# Patient Record
Sex: Female | Born: 1980 | Marital: Married | Smoking: Never smoker
Health system: Southern US, Community
[De-identification: ages and names within clinical notes are randomized; demographics above are authoritative.]

## PROBLEM LIST (undated history)

## (undated) DIAGNOSIS — Z973 Presence of spectacles and contact lenses: Secondary | ICD-10-CM

## (undated) DIAGNOSIS — K219 Gastro-esophageal reflux disease without esophagitis: Secondary | ICD-10-CM

## (undated) HISTORY — DX: Gastro-esophageal reflux disease without esophagitis: K21.9

---

## 2018-02-10 ENCOUNTER — Encounter: Payer: Self-pay | Admitting: Gastroenterology

## 2018-03-27 ENCOUNTER — Encounter: Payer: Self-pay | Admitting: Gastroenterology

## 2018-03-27 ENCOUNTER — Ambulatory Visit: Payer: 59 | Admitting: Gastroenterology

## 2018-03-27 ENCOUNTER — Other Ambulatory Visit: Payer: Self-pay

## 2018-03-27 VITALS — BP 120/84 | HR 94 | Ht 66.0 in | Wt 159.6 lb

## 2018-03-27 DIAGNOSIS — R196 Halitosis: Secondary | ICD-10-CM

## 2018-03-27 DIAGNOSIS — K219 Gastro-esophageal reflux disease without esophagitis: Secondary | ICD-10-CM | POA: Diagnosis not present

## 2018-03-27 NOTE — Progress Notes (Signed)
Arlyss Repressohini R Lorry Anastasi, MD [address]  [address]  [place], [place] [address]  Main: [phone]  Fax: [phone]    Gastroenterology Consultation  Referring Provider:     Wendy PoetBrooks, Jennifer Ann, P* Primary Care Physician:  Gus HeightBrooks, Jennifer Ann, PA-C Primary Gastroenterologist:  Dr. Arlyss Repressohini R Lemon Whitacre Reason for Consultation:     Reflux        HPI:   Verdon CumminsKatherine Beaudry is a 37 y.o. female referred by Dr. Shon BatonBrooks, Michell HeinrichJennifer Ann, PA-C  for consultation & management of chronic history of heartburn, throat discomfort, halitosis.  Patient initially saw a dentist due to halitosis and was told that she did not have any dental issues.  She then saw ENT, underwent direct laryngoscopy and she was told that she may have possible reflux and was started on omeprazole 20 mg twice daily.  She noticed that since commencement of omeprazole twice daily, her symptoms have significantly improved.  She no longer has these symptoms.  She gained weight in last 6 months to 1 year.  She also cut back on coffee which she figured was a trigger as well as orders.  Patient denies any nocturnal symptoms.  She has been on omeprazole 20mg  twice daily for last 2 months, in clinical remission  NSAIDs: None  Antiplts/Anticoagulants/Anti thrombotics: None  GI Procedures: None She denies family history of GI malignancy  History reviewed. No pertinent past medical history.  History reviewed. No pertinent surgical history.  Current Outpatient Medications:  .  fluticasone (FLONASE) 50 MCG/ACT nasal spray, Place 2 sprays into both nostrils daily., Disp: , Rfl: 3 .  omeprazole (PRILOSEC) 20 MG capsule, TAKE 1 CAPSULE BY MOUTH 30 MINUTES PRIOR TO BREAKFAST AND AT BEDTIME, Disp: , Rfl: 0   History reviewed. No pertinent family history.   Social History   Tobacco Use  . Smoking status: Never Smoker  . Smokeless tobacco: Never Used  Substance Use Topics  . Alcohol use: Yes  . Drug use: Not on file    Allergies as of  03/27/2018 - Review Complete 03/27/2018  Allergen Reaction Noted  . Tomato  03/27/2018    Review of Systems:    All systems reviewed and negative except where noted in HPI.   Physical Exam:  BP 120/84   Pulse 94   Ht 5\' 6"  (1.676 m)   Wt 159 lb 9.6 oz (72.4 kg)   BMI 25.76 kg/m  No LMP recorded.  General:   Alert,  Well-developed, well-nourished, pleasant and cooperative in NAD Head:  Normocephalic and atraumatic. Eyes:  Sclera clear, no icterus.   Conjunctiva pink. Ears:  Normal auditory acuity. Nose:  No deformity, discharge, or lesions. Mouth:  No deformity or lesions,oropharynx pink & moist. Neck:  Supple; no masses or thyromegaly. Lungs:  Respirations even and unlabored.  Clear throughout to auscultation.   No wheezes, crackles, or rhonchi. No acute distress. Heart:  Regular rate and rhythm; no murmurs, clicks, rubs, or gallops. Abdomen:  Normal bowel sounds. Soft, non-tender and non-distended without masses, hepatosplenomegaly or hernias noted.  No guarding or rebound tenderness.   Rectal: Not performed Msk:  Symmetrical without gross deformities. Good, equal movement & strength bilaterally. Pulses:  Normal pulses noted. Extremities:  No clubbing or edema.  No cyanosis. Neurologic:  Alert and oriented x3;  grossly normal neurologically. Skin:  Intact without significant lesions or rashes. No jaundice. Psych:  Alert and cooperative. Normal mood and affect.  Imaging Studies: None  Assessment and Plan:   Natalia LeatherwoodKatherine Mudry  is a 37 y.o. Caucasian female with no significant past medical history presents with chronic symptoms of throat discomfort, halitosis and heartburn, currently relieved by omeprazole 20 mg twice daily.  Reassured her that her symptoms are secondary to reflux and currently managed with acid suppressive therapy.  There is no indication for upper endoscopy as she does not demonstrate any alarm signs or symptoms.  Discussed with her about continuing  lifestyle modification, antireflux measures.  Suggested her to continue omeprazole 20 mg twice daily for at least 3 months before trial of dose reduction.  If her symptoms recur, will perform upper endoscopy   Follow up in 3 months   Arlyss Repress, MD

## 2018-04-10 ENCOUNTER — Telehealth: Payer: Self-pay | Admitting: Gastroenterology

## 2018-04-10 NOTE — Telephone Encounter (Signed)
Pt left vm she was seen 03/27/2018 and she was supposed to have rx called in to [facility] for rx Omeprazole 20 mg and it was never called in

## 2018-04-11 MED ORDER — OMEPRAZOLE 20 MG PO CPDR: 20.0000 mg | DELAYED_RELEASE_CAPSULE | Freq: Two times a day (BID) | ORAL | 0 refills | Status: DC

## 2018-04-11 NOTE — Telephone Encounter (Signed)
Medication has been sent to pharmacy, and pt has been notified

## 2018-05-03 ENCOUNTER — Other Ambulatory Visit: Payer: Self-pay | Admitting: Gastroenterology

## 2018-06-27 ENCOUNTER — Ambulatory Visit (INDEPENDENT_AMBULATORY_CARE_PROVIDER_SITE_OTHER): Payer: 59 | Admitting: Obstetrics and Gynecology

## 2018-06-27 ENCOUNTER — Other Ambulatory Visit (HOSPITAL_COMMUNITY)
Admission: RE | Admit: 2018-06-27 | Discharge: 2018-06-27 | Disposition: A | Discharge status: Home / Self Care | Payer: 59 | Source: Ambulatory Visit | Attending: Obstetrics and Gynecology | Admitting: Obstetrics and Gynecology

## 2018-06-27 ENCOUNTER — Encounter: Payer: Self-pay | Admitting: Obstetrics and Gynecology

## 2018-06-27 VITALS — BP 104/70 | HR 104 | Ht 66.0 in | Wt 165.0 lb

## 2018-06-27 DIAGNOSIS — Z1151 Encounter for screening for human papillomavirus (HPV): Secondary | ICD-10-CM

## 2018-06-27 DIAGNOSIS — Z01419 Encounter for gynecological examination (general) (routine) without abnormal findings: Secondary | ICD-10-CM | POA: Diagnosis not present

## 2018-06-27 DIAGNOSIS — Z124 Encounter for screening for malignant neoplasm of cervix: Secondary | ICD-10-CM | POA: Diagnosis not present

## 2018-06-27 DIAGNOSIS — Z3041 Encounter for surveillance of contraceptive pills: Secondary | ICD-10-CM

## 2018-06-27 MED ORDER — KARIVA 0.15-0.02/0.01 MG (21/5) PO TABS: 1.0000 | ORAL_TABLET | Freq: Every day | ORAL | 3 refills | Status: DC

## 2018-06-27 NOTE — Patient Instructions (Signed)
I value your feedback and entrusting us with your care. If you get a [facility] patient survey, I would appreciate you taking the time to let us know about your experience today. Thank you! 

## 2018-06-27 NOTE — Progress Notes (Signed)
PCP:  Gus Height, PA-C   Chief Complaint  Patient presents with  . Gynecologic Exam     HPI:      [name] is a 38 y.o. No obstetric history on file. who LMP was Patient's last menstrual period was 06/15/2018 (exact date)., presents today for her NP annual examination.  Her menses are regular every 28-30 days, lasting 4 days.  Dysmenorrhea none. She does not have intermenstrual bleeding.  Sex activity: single partner, contraception - OCP (estrogen/progesterone). OCPs Last Pap: 2 yrs ago, no hx of abn Hx of STDs: none  There is no FH of breast cancer. There is no FH of ovarian cancer. The patient does do self-breast exams.  Tobacco use: The patient denies current or previous tobacco use. Alcohol use: social drinker No drug use.  Exercise: moderately active  She does get adequate calcium and Vitamin D in her diet.   Past Medical History:  Diagnosis Date  . Acid reflux    omeprazole prescribed by GI, pt produces too much acid    History reviewed. No pertinent surgical history.  Family History  Problem Relation Age of Onset  . Breast cancer Neg Hx   . Ovarian cancer Neg Hx     Social History   Socioeconomic History  . Marital status: Married    Spouse name: Not on file  . Number of children: Not on file  . Years of education: Not on file  . Highest education level: Not on file  Occupational History  . Not on file  Social Needs  . Financial resource strain: Not on file  . Food insecurity:    Worry: Not on file    Inability: Not on file  . Transportation needs:    Medical: Not on file    Non-medical: Not on file  Tobacco Use  . Smoking status: Never Smoker  . Smokeless tobacco: Never Used  Substance and Sexual Activity  . Alcohol use: Yes  . Drug use: Never  . Sexual activity: Yes    Birth control/protection: Pill  Lifestyle  . Physical activity:    Days per week: Not on file    Minutes per session: Not on file  . Stress:  Not on file  Relationships  . Social connections:    Talks on phone: Not on file    Gets together: Not on file    Attends religious service: Not on file    Active member of club or organization: Not on file    Attends meetings of clubs or organizations: Not on file    Relationship status: Not on file  . Intimate partner violence:    Fear of current or ex partner: Not on file    Emotionally abused: Not on file    Physically abused: Not on file    Forced sexual activity: Not on file  Other Topics Concern  . Not on file  Social History Narrative  . Not on file    Outpatient Medications Prior to Visit  Medication Sig Dispense Refill  . fluticasone (FLONASE) 50 MCG/ACT nasal spray Place 2 sprays into both nostrils daily.  3  . omeprazole (PRILOSEC) 20 MG capsule TAKE 1 CAPSULE 2 TIMES DAILY BEFORE A MEAL (30 MINUTES PRIOR TO BREAKFAST AND BEDTIME) 60 capsule 0  . KARIVA 0.15-0.02/0.01 MG (21/5) tablet      No facility-administered medications prior to visit.       ROS:  Review of Systems  Constitutional: Negative for fatigue,  fever and unexpected weight change.  Respiratory: Negative for cough, shortness of breath and wheezing.   Cardiovascular: Negative for chest pain, palpitations and leg swelling.  Gastrointestinal: Negative for blood in stool, constipation, diarrhea, nausea and vomiting.  Endocrine: Negative for cold intolerance, heat intolerance and polyuria.  Genitourinary: Negative for dyspareunia, dysuria, flank pain, frequency, genital sores, hematuria, menstrual problem, pelvic pain, urgency, vaginal bleeding, vaginal discharge and vaginal pain.  Musculoskeletal: Negative for back pain, joint swelling and myalgias.  Skin: Negative for rash.  Neurological: Negative for dizziness, syncope, light-headedness, numbness and headaches.  Hematological: Negative for adenopathy.  Psychiatric/Behavioral: Negative for agitation, confusion, sleep disturbance and suicidal ideas.  The patient is not nervous/anxious.    BREAST: No symptoms   Objective: BP 104/70   Pulse (!) 104   Ht 5\' 6"  (1.676 m)   Wt 165 lb (74.8 kg)   LMP 06/15/2018 (Exact Date)   BMI 26.63 kg/m    Physical Exam Constitutional:      Appearance: She is well-developed.  Genitourinary:     Vulva, vagina, cervix, uterus, right adnexa and left adnexa normal.     No vulval lesion or tenderness noted.     No vaginal discharge, erythema or tenderness.     No cervical polyp.     Uterus is not enlarged or tender.     No right or left adnexal mass present.     Right adnexa not tender.     Left adnexa not tender.  Neck:     Musculoskeletal: Normal range of motion.     Thyroid: No thyromegaly.  Cardiovascular:     Rate and Rhythm: Normal rate and regular rhythm.     Heart sounds: Normal heart sounds. No murmur.  Pulmonary:     Effort: Pulmonary effort is normal.     Breath sounds: Normal breath sounds.  Chest:     Breasts:        Right: No mass, nipple discharge, skin change or tenderness.        Left: No mass, nipple discharge, skin change or tenderness.  Abdominal:     Palpations: Abdomen is soft.     Tenderness: There is no abdominal tenderness. There is no guarding.  Musculoskeletal: Normal range of motion.  Neurological:     General: No focal deficit present.     Mental Status: She is alert and oriented to person, place, and time.     Cranial Nerves: No cranial nerve deficit.  Skin:    General: Skin is warm and dry.  Psychiatric:        Mood and Affect: Mood normal.        Behavior: Behavior normal.        Thought Content: Thought content normal.        Judgment: Judgment normal.  Vitals signs reviewed.     Assessment/Plan: Encounter for annual routine gynecological examination  Cervical cancer screening - Plan: Cytology - PAP  Screening for HPV (human papillomavirus) - Plan: Cytology - PAP  Encounter for surveillance of contraceptive pills - OCP RF - Plan: KARIVA  0.15-0.02/0.01 MG (21/5) tablet  Meds ordered this encounter  Medications  . KARIVA 0.15-0.02/0.01 MG (21/5) tablet    Sig: Take 1 tablet by mouth daily for 28 days.    Dispense:  3 Package    Refill:  3    Order Specific Question:   Supervising Provider    Answer:   Nadara Mustard [phone]  GYN counsel adequate intake of calcium and vitamin D, diet and exercise     F/U  Return in about 1 year (around 06/27/2019).  Jorel Gravlin B. Jadasia Haws, PA-C 06/27/2018 3:17 PM

## 2018-06-29 LAB — CYTOLOGY - PAP
Diagnosis: NEGATIVE
HPV (WINDOPATH): NOT DETECTED

## 2018-07-03 ENCOUNTER — Ambulatory Visit: Payer: 59 | Admitting: Gastroenterology

## 2018-12-18 ENCOUNTER — Encounter (INDEPENDENT_AMBULATORY_CARE_PROVIDER_SITE_OTHER): Payer: Self-pay

## 2018-12-18 ENCOUNTER — Other Ambulatory Visit: Payer: Self-pay

## 2018-12-18 ENCOUNTER — Ambulatory Visit: Payer: 59 | Admitting: Gastroenterology

## 2018-12-18 ENCOUNTER — Other Ambulatory Visit: Payer: Self-pay | Admitting: Gastroenterology

## 2018-12-18 ENCOUNTER — Encounter: Payer: Self-pay | Admitting: Gastroenterology

## 2018-12-18 VITALS — BP 126/85 | HR 118 | Temp 99.5°F | Ht 66.0 in | Wt 169.4 lb

## 2018-12-18 DIAGNOSIS — R1013 Epigastric pain: Secondary | ICD-10-CM | POA: Diagnosis not present

## 2018-12-18 NOTE — Patient Instructions (Signed)
During today's visits labs were completed.  Dr.Vanga's nurse will contact you once your results have been received and reviewed by Dr. Marius Ditch to advise.  Please follow up with our office as needed or sooner depending on lab results.  Have a great day, and thank you for letting [facility] serve your healthcare needs.

## 2018-12-18 NOTE — Progress Notes (Addendum)
Cephas Darby, MD [address]  [place]  [place], [place] [address]  Main: [phone]  Fax: [phone]    Gastroenterology Consultation  Referring Provider:     Bethena Roys* Primary Care Physician:  Arturo Morton, PA-C Primary Gastroenterologist:  Dr. Cephas Darby Reason for Consultation: Dyspepsia     HPI:   Alison Hunt is a 38 y.o. female referred by Dr. Rolena Infante, Jamelle Rushing, PA-C  for consultation & management of chronic history of heartburn, throat discomfort, halitosis.  Patient initially saw a dentist due to halitosis and was told that she did not have any dental issues.  She then saw ENT, underwent direct laryngoscopy and she was told that she may have possible reflux and was started on omeprazole 20 mg twice daily.  She noticed that since commencement of omeprazole twice daily, her symptoms have significantly improved.  She no longer has these symptoms.  She gained weight in last 6 months to 1 year.  She also cut back on coffee which she figured was a trigger as well as orders.  Patient denies any nocturnal symptoms.  She has been on omeprazole 20mg  twice daily for last 2 months, in clinical remission  Follow-up visit 12/18/2018 For last 3 to 4 months, she has been experiencing epigastric burning pain which is relieved with food, associated with bloating, postprandial urgency and runny stools.  She has been gaining 1 pound a month.  She denies heartburn.  She is currently taking omeprazole 20 mg twice daily.   NSAIDs: None  Antiplts/Anticoagulants/Anti thrombotics: None  GI Procedures: None She denies family history of GI malignancy  Past Medical History:  Diagnosis Date  . Acid reflux    omeprazole prescribed by GI, pt produces too much acid    History reviewed. No pertinent surgical history.  Current Outpatient Medications:  .  fluticasone (FLONASE) 50 MCG/ACT nasal spray, Place 2 sprays into both nostrils daily., Disp: ,  Rfl: 3 .  omeprazole (PRILOSEC) 20 MG capsule, TAKE 1 CAPSULE 2 TIMES DAILY BEFORE A MEAL (30 MINUTES PRIOR TO BREAKFAST AND BEDTIME), Disp: 60 capsule, Rfl: 0 .  KARIVA 0.15-0.02/0.01 MG (21/5) tablet, Take 1 tablet by mouth daily for 28 days., Disp: 3 Package, Rfl: 3   Family History  Problem Relation Age of Onset  . Breast cancer Neg Hx   . Ovarian cancer Neg Hx      Social History   Tobacco Use  . Smoking status: Never Smoker  . Smokeless tobacco: Never Used  Substance Use Topics  . Alcohol use: Yes  . Drug use: Never    Allergies as of 12/18/2018 - Review Complete 12/18/2018  Allergen Reaction Noted  . Tomato  03/27/2018    Review of Systems:    All systems reviewed and negative except where noted in HPI.   Physical Exam:  BP 126/85   Pulse (!) 118   Temp 99.5 F (37.5 C) (Oral)   Ht 5\' 6"  (1.676 m)   Wt 169 lb 6.4 oz (76.8 kg)   BMI 27.34 kg/m  No LMP recorded.  General:   Alert,  Well-developed, well-nourished, pleasant and cooperative in NAD Head:  Normocephalic and atraumatic. Eyes:  Sclera clear, no icterus.   Conjunctiva pink. Ears:  Normal auditory acuity. Nose:  No deformity, discharge, or lesions. Mouth:  No deformity or lesions,oropharynx pink & moist. Neck:  Supple; no masses or thyromegaly. Lungs:  Respirations even and unlabored.  Clear throughout to auscultation.  No wheezes, crackles, or rhonchi. No acute distress. Heart:  Regular rate and rhythm; no murmurs, clicks, rubs, or gallops. Abdomen:  Normal bowel sounds. Soft, non-tender and non-distended without masses, hepatosplenomegaly or hernias noted.  No guarding or rebound tenderness.   Rectal: Not performed Msk:  Symmetrical without gross deformities. Good, equal movement & strength bilaterally. Pulses:  Normal pulses noted. Extremities:  No clubbing or edema.  No cyanosis. Neurologic:  Alert and oriented x3;  grossly normal neurologically. Skin:  Intact without significant lesions or  rashes. No jaundice. Psych:  Alert and cooperative. Normal mood and affect.  Imaging Studies: None  Assessment and Plan:   Alison Hunt is a 38 y.o. Caucasian female with no significant past medical history is here for follow-up of chronic dyspepsia.  Patient is currently on omeprazole 20 mg twice daily.  I discussed with her about upper endoscopy which she is reluctant to undergo due to high deductible with her insurance.  She agreed to undergo H. pylori IgG, H. pylori breath test as she is on PPI.  If either test comes back positive, will treat her with triple therapy for H. pylori infection  Follow up as needed   Arlyss Repressohini R Vanga, MD

## 2018-12-19 LAB — H. PYLORI ANTIBODY, IGG: H. pylori, IgG AbS: 0.2 Index Value (ref 0.00–0.79)

## 2018-12-21 ENCOUNTER — Telehealth: Payer: Self-pay | Admitting: Gastroenterology

## 2018-12-21 NOTE — Telephone Encounter (Signed)
Patient called asking for her lab & breathing results.

## 2018-12-25 ENCOUNTER — Encounter: Payer: Self-pay | Admitting: Gastroenterology

## 2018-12-25 NOTE — Telephone Encounter (Signed)
Returned patient call.  Left voicemail H. pylori IgG antibody negative H. pylori breath test results did not come back.  Called the lab to find out the status Results should be faxed soon  Cephas Darby, MD [address]  [place]  [place], [place] [address]  Main: [phone]  Fax: [phone] Pager: [phone]

## 2018-12-26 ENCOUNTER — Other Ambulatory Visit: Payer: Self-pay

## 2018-12-26 ENCOUNTER — Telehealth: Payer: Self-pay

## 2018-12-26 DIAGNOSIS — R1013 Epigastric pain: Secondary | ICD-10-CM

## 2018-12-26 NOTE — Telephone Encounter (Signed)
-----   Message from Lin Landsman, MD sent at 12/25/2018  3:38 PM EDT ----- Please let her know that her H Pylori breath test also came back negative  Thanks RV

## 2018-12-26 NOTE — Telephone Encounter (Signed)
Pt notified of lab results and scheduled for an EGD.

## 2018-12-28 LAB — SPECIMEN STATUS REPORT

## 2018-12-28 LAB — H. PYLORI BREATH TEST: H pylori Breath Test: NEGATIVE

## 2019-02-08 ENCOUNTER — Other Ambulatory Visit: Payer: Self-pay

## 2019-02-08 DIAGNOSIS — R1013 Epigastric pain: Secondary | ICD-10-CM

## 2019-02-14 ENCOUNTER — Encounter: Payer: Self-pay | Admitting: *Deleted

## 2019-02-14 ENCOUNTER — Other Ambulatory Visit: Payer: Self-pay

## 2019-02-16 ENCOUNTER — Other Ambulatory Visit
Admission: RE | Admit: 2019-02-16 | Discharge: 2019-02-16 | Disposition: A | Discharge status: Home / Self Care | Payer: 59 | Source: Ambulatory Visit | Attending: Gastroenterology | Admitting: Gastroenterology

## 2019-02-16 DIAGNOSIS — Z01812 Encounter for preprocedural laboratory examination: Secondary | ICD-10-CM | POA: Diagnosis present

## 2019-02-16 DIAGNOSIS — Z20828 Contact with and (suspected) exposure to other viral communicable diseases: Secondary | ICD-10-CM | POA: Diagnosis not present

## 2019-02-16 LAB — SARS CORONAVIRUS 2 (TAT 6-24 HRS): SARS Coronavirus 2: NEGATIVE

## 2019-02-20 NOTE — Discharge Instructions (Signed)

## 2019-02-21 ENCOUNTER — Ambulatory Visit
Admission: RE | Admit: 2019-02-21 | Discharge: 2019-02-21 | Disposition: A | Discharge status: Home / Self Care | Payer: 59 | Attending: Gastroenterology | Admitting: Gastroenterology

## 2019-02-21 ENCOUNTER — Encounter
Admission: RE | Disposition: A | Discharge status: Home / Self Care | Payer: Self-pay | Source: Home / Self Care | Attending: Gastroenterology

## 2019-02-21 ENCOUNTER — Other Ambulatory Visit: Payer: Self-pay

## 2019-02-21 ENCOUNTER — Ambulatory Visit: Payer: 59 | Admitting: Anesthesiology

## 2019-02-21 DIAGNOSIS — R1013 Epigastric pain: Secondary | ICD-10-CM | POA: Diagnosis not present

## 2019-02-21 DIAGNOSIS — K219 Gastro-esophageal reflux disease without esophagitis: Secondary | ICD-10-CM | POA: Insufficient documentation

## 2019-02-21 DIAGNOSIS — K2950 Unspecified chronic gastritis without bleeding: Secondary | ICD-10-CM | POA: Insufficient documentation

## 2019-02-21 DIAGNOSIS — Z793 Long term (current) use of hormonal contraceptives: Secondary | ICD-10-CM | POA: Insufficient documentation

## 2019-02-21 HISTORY — PX: ESOPHAGOGASTRODUODENOSCOPY (EGD) WITH PROPOFOL: SHX5813

## 2019-02-21 HISTORY — DX: Presence of spectacles and contact lenses: Z97.3

## 2019-02-21 LAB — POCT PREGNANCY, URINE: Preg Test, Ur: NEGATIVE

## 2019-02-21 SURGERY — ESOPHAGOGASTRODUODENOSCOPY (EGD) WITH PROPOFOL
Anesthesia: General

## 2019-02-21 MED ORDER — GLYCOPYRROLATE 0.2 MG/ML IJ SOLN
INTRAMUSCULAR | Status: DC | PRN
  Administered 2019-02-21: 0.1 mg via INTRAVENOUS

## 2019-02-21 MED ORDER — LIDOCAINE HCL (CARDIAC) PF 100 MG/5ML IV SOSY
PREFILLED_SYRINGE | INTRAVENOUS | Status: DC | PRN
  Administered 2019-02-21: 30 mg via INTRAVENOUS

## 2019-02-21 MED ORDER — LACTATED RINGERS IV SOLN
INTRAVENOUS | Status: DC
  Administered 2019-02-21: 09:00:00 via INTRAVENOUS

## 2019-02-21 MED ORDER — STERILE WATER FOR IRRIGATION IR SOLN
Status: DC | PRN
  Administered 2019-02-21: 10:00:00

## 2019-02-21 MED ORDER — SODIUM CHLORIDE 0.9 % IV SOLN: INTRAVENOUS | Status: DC

## 2019-02-21 MED ORDER — PROPOFOL 10 MG/ML IV BOLUS
INTRAVENOUS | Status: DC | PRN
  Administered 2019-02-21: 40 mg via INTRAVENOUS
  Administered 2019-02-21 (×2): 30 mg via INTRAVENOUS
  Administered 2019-02-21: 150 mg via INTRAVENOUS

## 2019-02-21 SURGICAL SUPPLY — 33 items
BALLN DILATOR 10-12 8 (BALLOONS)
BALLN DILATOR 12-15 8 (BALLOONS)
BALLN DILATOR 15-18 8 (BALLOONS)
BALLN DILATOR CRE 0-12 8 (BALLOONS)
BALLN DILATOR ESOPH 8 10 CRE (MISCELLANEOUS) IMPLANT
BALLOON DILATOR 12-15 8 (BALLOONS) IMPLANT
BALLOON DILATOR 15-18 8 (BALLOONS) IMPLANT
BALLOON DILATOR CRE 0-12 8 (BALLOONS) IMPLANT
BLOCK BITE 60FR ADLT L/F GRN (MISCELLANEOUS) ×3 IMPLANT
CANISTER SUCT 1200ML W/VALVE (MISCELLANEOUS) ×3 IMPLANT
CLIP HMST 235XBRD CATH ROT (MISCELLANEOUS) IMPLANT
CLIP RESOLUTION 360 11X235 (MISCELLANEOUS)
ELECT REM PT RETURN 9FT ADLT (ELECTROSURGICAL)
ELECTRODE REM PT RTRN 9FT ADLT (ELECTROSURGICAL) IMPLANT
FCP ESCP3.2XJMB 240X2.8X (MISCELLANEOUS) ×1
FORCEPS BIOP RAD 4 LRG CAP 4 (CUTTING FORCEPS) IMPLANT
FORCEPS BIOP RJ4 240 W/NDL (MISCELLANEOUS) ×2
FORCEPS ESCP3.2XJMB 240X2.8X (MISCELLANEOUS) ×1 IMPLANT
GOWN CVR UNV OPN BCK APRN NK (MISCELLANEOUS) ×2 IMPLANT
GOWN ISOL THUMB LOOP REG UNIV (MISCELLANEOUS) ×4
INJECTOR VARIJECT VIN23 (MISCELLANEOUS) IMPLANT
KIT DEFENDO VALVE AND CONN (KITS) IMPLANT
KIT ENDO PROCEDURE OLY (KITS) ×3 IMPLANT
MARKER SPOT ENDO TATTOO 5ML (MISCELLANEOUS) IMPLANT
RETRIEVER NET PLAT FOOD (MISCELLANEOUS) IMPLANT
SNARE SHORT THROW 13M SML OVAL (MISCELLANEOUS) IMPLANT
SNARE SHORT THROW 30M LRG OVAL (MISCELLANEOUS) IMPLANT
SPOT EX ENDOSCOPIC TATTOO (MISCELLANEOUS)
SYR INFLATION 60ML (SYRINGE) IMPLANT
TRAP ETRAP POLY (MISCELLANEOUS) IMPLANT
VARIJECT INJECTOR VIN23 (MISCELLANEOUS)
WATER STERILE IRR 250ML POUR (IV SOLUTION) ×3 IMPLANT
WIRE CRE 18-20MM 8CM F G (MISCELLANEOUS) IMPLANT

## 2019-02-21 NOTE — Transfer of Care (Signed)
Immediate Anesthesia Transfer of Care Note  Patient: Alison Hunt  Procedure(s) Performed: ESOPHAGOGASTRODUODENOSCOPY (EGD) WITH PROPOFOL (N/A )  Patient Location: PACU  Anesthesia Type: General  Level of Consciousness: awake, alert  and patient cooperative  Airway and Oxygen Therapy: Patient Spontanous Breathing and Patient connected to supplemental oxygen  Post-op Assessment: Post-op Vital signs reviewed, Patient's Cardiovascular Status Stable, Respiratory Function Stable, Patent Airway and No signs of Nausea or vomiting  Post-op Vital Signs: Reviewed and stable  Complications: No apparent anesthesia complications

## 2019-02-21 NOTE — Anesthesia Procedure Notes (Signed)
Date/Time: 02/21/2019 9:41 AM Performed by: Cameron Ali, CRNA Pre-anesthesia Checklist: Patient identified, Emergency Drugs available, Suction available, Timeout performed and Patient being monitored Patient Re-evaluated:Patient Re-evaluated prior to induction Oxygen Delivery Method: Nasal cannula Placement Confirmation: positive ETCO2

## 2019-02-21 NOTE — Anesthesia Preprocedure Evaluation (Signed)
Anesthesia Evaluation  Patient identified by MRN, date of birth, ID band Patient awake    Reviewed: Allergy & Precautions, NPO status , Patient's Chart, lab work & pertinent test results  History of Anesthesia Complications Negative for: history of anesthetic complications  Airway Mallampati: I  TM Distance: >3 FB Neck ROM: Full    Dental no notable dental hx.    Pulmonary neg pulmonary ROS,    Pulmonary exam normal breath sounds clear to auscultation       Cardiovascular Exercise Tolerance: Good Normal cardiovascular exam Rhythm:Regular Rate:Normal     Neuro/Psych negative neurological ROS     GI/Hepatic Neg liver ROS, GERD  Medicated,  Endo/Other  negative endocrine ROS  Renal/GU negative Renal ROS  negative genitourinary   Musculoskeletal negative musculoskeletal ROS (+)   Abdominal (+) - obese,  Abdomen: soft.    Peds  Hematology negative hematology ROS (+)   Anesthesia Other Findings   Reproductive/Obstetrics negative OB ROS                             Anesthesia Physical Anesthesia Plan  ASA: I  Anesthesia Plan: General   Post-op Pain Management:    Induction:   PONV Risk Score and Plan: 2 and Propofol infusion  Airway Management Planned: Natural Airway  Additional Equipment:   Intra-op Plan:   Post-operative Plan:   Informed Consent: I have reviewed the patients History and Physical, chart, labs and discussed the procedure including the risks, benefits and alternatives for the proposed anesthesia with the patient or authorized representative who has indicated his/her understanding and acceptance.     Dental advisory given  Plan Discussed with:   Anesthesia Plan Comments:         Anesthesia Quick Evaluation  There are no active problems to display for this patient.   No flowsheet data found. No flowsheet data found.  Risks and benefits of  anesthesia discussed at length, patient or surrogate demonstrates understanding. Appropriately NPO. Plan to proceed with anesthesia.  Champ Mungo, MD 02/21/19

## 2019-02-21 NOTE — Anesthesia Postprocedure Evaluation (Signed)
Anesthesia Post Note  Patient: Alison Hunt  Procedure(s) Performed: ESOPHAGOGASTRODUODENOSCOPY (EGD) WITH PROPOFOL (N/A )  Patient location during evaluation: PACU Anesthesia Type: General Level of consciousness: awake and alert Pain management: pain level controlled Vital Signs Assessment: post-procedure vital signs reviewed and stable Respiratory status: spontaneous breathing, nonlabored ventilation, respiratory function stable and patient connected to nasal cannula oxygen Cardiovascular status: blood pressure returned to baseline and stable Postop Assessment: no apparent nausea or vomiting Anesthetic complications: no    Sinda Du

## 2019-02-21 NOTE — Op Note (Signed)
[facility] Patient Name: Alison Hunt Procedure Date: 02/21/2019 9:32 AM MRN: [id number] Account #: [id number] Date of Birth: [birth year] Admit Type: Outpatient Age: 38 Room: [facility] OR ROOM 01 Gender: Female Note Status: Finalized Procedure:            Upper GI endoscopy Indications:          Dyspepsia Providers:            Lin Landsman MD, MD Medicines:            Monitored Anesthesia Care Complications:        No immediate complications. Estimated blood loss: None. Procedure:            Pre-Anesthesia Assessment:                       - Prior to the procedure, a History and Physical was                        performed, and patient medications and allergies were                        reviewed. The patient is competent. The risks and                        benefits of the procedure and the sedation options and                        risks were discussed with the patient. All questions                        were answered and informed consent was obtained.                        Patient identification and proposed procedure were                        verified by the physician, the nurse, the                        anesthesiologist, the anesthetist and the technician in                        the pre-procedure area in the procedure room in the                        endoscopy suite. Mental Status Examination: alert and                        oriented. Airway Examination: normal oropharyngeal                        airway and neck mobility. Respiratory Examination:                        clear to auscultation. CV Examination: normal.                        Prophylactic Antibiotics: The patient does not require  prophylactic antibiotics. Prior Anticoagulants: The                        patient has taken no previous anticoagulant or                        antiplatelet agents. ASA Grade Assessment: I - A              normal, healthy patient. After reviewing the risks and                        benefits, the patient was deemed in satisfactory                        condition to undergo the procedure. The anesthesia plan                        was to use monitored anesthesia care (MAC). Immediately                        prior to administration of medications, the patient was                        re-assessed for adequacy to receive sedatives. The                        heart rate, respiratory rate, oxygen saturations, blood                        pressure, adequacy of pulmonary ventilation, and                        response to care were monitored throughout the                        procedure. The physical status of the patient was                        re-assessed after the procedure.                       After obtaining informed consent, the endoscope was                        passed under direct vision. Throughout the procedure,                        the patient's blood pressure, pulse, and oxygen                        saturations were monitored continuously. The was                        introduced through the mouth, and advanced to the                        second part of duodenum. The upper GI endoscopy was                        accomplished without difficulty. The patient tolerated  the procedure well. Findings:      The duodenal bulb and second portion of the duodenum were normal.       Biopsies for histology were taken with a cold forceps for evaluation of       celiac disease.      The entire examined stomach was normal. Biopsies were taken with a cold       forceps for Helicobacter pylori testing.      The cardia and gastric fundus were normal on retroflexion.      Esophagogastric landmarks were identified: the gastroesophageal junction       was found at 40 cm from the incisors.      The gastroesophageal junction and examined esophagus were  normal. Impression:           - Normal duodenal bulb and second portion of the                        duodenum. Biopsied.                       - Normal stomach. Biopsied.                       - Esophagogastric landmarks identified.                       - Normal gastroesophageal junction and esophagus. Recommendation:       - Discharge patient to home (with escort).                       - Resume previous diet today.                       - Continue present medications.                       - Await pathology results.                       - Return to my office as previously scheduled. Procedure Code(s):    --- Professional ---                       [phone], Esophagogastroduodenoscopy, flexible, transoral;                        with biopsy, single or multiple Diagnosis Code(s):    --- Professional ---                       R10.13, Epigastric pain CPT copyright 2019 American Medical Association. All rights reserved. The codes documented in this report are preliminary and upon coder review may  be revised to meet current compliance requirements. Dr. Ulyess Mort Lin Landsman MD, MD 02/21/2019 10:00:48 AM This report has been signed electronically. Number of Addenda: 0 Note Initiated On: 02/21/2019 9:32 AM Estimated Blood Loss: Estimated blood loss: none.      [facility]

## 2019-02-21 NOTE — H&P (Signed)
Cephas Darby, MD [address]  [place]  [place], [place] [address]  Main: [phone]  Fax: [phone] Pager: [phone]  Primary Care Physician:  Arturo Morton, PA-C Primary Gastroenterologist:  Dr. Cephas Darby  Pre-Procedure History & Physical: HPI:  Alison Hunt is a 38 y.o. female is here for an endoscopy.   Past Medical History:  Diagnosis Date  . Acid reflux    omeprazole prescribed by GI, pt produces too much acid  . Wears contact lenses     Past Surgical History:  Procedure Laterality Date  . CESAREAN SECTION      Prior to Admission medications   Medication Sig Start Date End Date Taking? Authorizing Provider  fluticasone (FLONASE) 50 MCG/ACT nasal spray Place 2 sprays into both nostrils daily. 03/05/18  Yes [provider]  KARIVA 0.15-0.02/0.01 MG (21/5) tablet Take 1 tablet by mouth daily for 28 days. 06/27/18 18/29/93 Yes Copland, Deirdre Evener, PA-C  Multiple Vitamin (MULTIVITAMIN PO) Take by mouth daily.   Yes [provider]  omeprazole (PRILOSEC) 20 MG capsule TAKE 1 CAPSULE 2 TIMES DAILY BEFORE A MEAL (30 MINUTES PRIOR TO BREAKFAST AND BEDTIME) 05/03/18  Yes Lin Landsman, MD    Allergies as of 02/08/2019 - Review Complete 12/18/2018  Allergen Reaction Noted  . Tomato  03/27/2018    Family History  Problem Relation Age of Onset  . Breast cancer Neg Hx   . Ovarian cancer Neg Hx     Social History   Socioeconomic History  . Marital status: Married    Spouse name: Not on file  . Number of children: Not on file  . Years of education: Not on file  . Highest education level: Not on file  Occupational History  . Not on file  Social Needs  . Financial resource strain: Not on file  . Food insecurity    Worry: Not on file    Inability: Not on file  . Transportation needs    Medical: Not on file    Non-medical: Not on file  Tobacco Use  . Smoking status: Never Smoker  . Smokeless tobacco: Never Used   Substance and Sexual Activity  . Alcohol use: Not Currently  . Drug use: Never  . Sexual activity: Yes    Birth control/protection: Pill  Lifestyle  . Physical activity    Days per week: Not on file    Minutes per session: Not on file  . Stress: Not on file  Relationships  . Social Herbalist on phone: Not on file    Gets together: Not on file    Attends religious service: Not on file    Active member of club or organization: Not on file    Attends meetings of clubs or organizations: Not on file    Relationship status: Not on file  . Intimate partner violence    Fear of current or ex partner: Not on file    Emotionally abused: Not on file    Physically abused: Not on file    Forced sexual activity: Not on file  Other Topics Concern  . Not on file  Social History Narrative  . Not on file    Review of Systems: See HPI, otherwise negative ROS  Physical Exam: BP 112/71   Pulse 81   Temp 98.1 F (36.7 C) (Temporal)   Ht 5\' 6"  (1.676 m)   Wt 73 kg   LMP 01/23/2019 (Approximate) Comment: preg test neg  SpO2 100%   BMI 25.99 kg/m  General:   Alert,  pleasant and cooperative in NAD Head:  Normocephalic and atraumatic. Neck:  Supple; no masses or thyromegaly. Lungs:  Clear throughout to auscultation.    Heart:  Regular rate and rhythm. Abdomen:  Soft, nontender and nondistended. Normal bowel sounds, without guarding, and without rebound.   Neurologic:  Alert and  oriented x4;  grossly normal neurologically.  Impression/Plan: Alison Hunt is here for an endoscopy to be performed for dyspepsia  Risks, benefits, limitations, and alternatives regarding  endoscopy have been reviewed with the patient.  Questions have been answered.  All parties agreeable.   Lannette Donath, MD  02/21/2019, 8:48 AM

## 2019-02-22 ENCOUNTER — Encounter: Payer: Self-pay | Admitting: Gastroenterology

## 2019-02-23 ENCOUNTER — Other Ambulatory Visit: Payer: Self-pay | Admitting: Gastroenterology

## 2019-02-23 ENCOUNTER — Other Ambulatory Visit: Payer: Self-pay

## 2019-02-23 DIAGNOSIS — R1011 Right upper quadrant pain: Secondary | ICD-10-CM

## 2019-02-23 DIAGNOSIS — R1013 Epigastric pain: Secondary | ICD-10-CM

## 2019-02-26 ENCOUNTER — Other Ambulatory Visit: Payer: Self-pay

## 2019-02-26 DIAGNOSIS — R1013 Epigastric pain: Secondary | ICD-10-CM

## 2019-02-27 ENCOUNTER — Other Ambulatory Visit: Payer: Self-pay

## 2019-02-27 ENCOUNTER — Ambulatory Visit
Admission: RE | Admit: 2019-02-27 | Discharge: 2019-02-27 | Disposition: A | Discharge status: Home / Self Care | Payer: 59 | Source: Ambulatory Visit | Attending: Gastroenterology | Admitting: Gastroenterology

## 2019-02-27 DIAGNOSIS — R1011 Right upper quadrant pain: Secondary | ICD-10-CM | POA: Insufficient documentation

## 2019-03-13 ENCOUNTER — Other Ambulatory Visit (INDEPENDENT_AMBULATORY_CARE_PROVIDER_SITE_OTHER): Payer: Self-pay | Admitting: Nurse Practitioner

## 2019-03-13 DIAGNOSIS — I83811 Varicose veins of right lower extremities with pain: Secondary | ICD-10-CM

## 2019-03-14 ENCOUNTER — Ambulatory Visit (INDEPENDENT_AMBULATORY_CARE_PROVIDER_SITE_OTHER): Payer: 59

## 2019-03-14 ENCOUNTER — Encounter (INDEPENDENT_AMBULATORY_CARE_PROVIDER_SITE_OTHER): Payer: Self-pay | Admitting: Nurse Practitioner

## 2019-03-14 ENCOUNTER — Other Ambulatory Visit: Payer: Self-pay

## 2019-03-14 ENCOUNTER — Ambulatory Visit (INDEPENDENT_AMBULATORY_CARE_PROVIDER_SITE_OTHER): Payer: 59 | Admitting: Nurse Practitioner

## 2019-03-14 VITALS — BP 107/71 | HR 69 | Resp 16 | Wt 158.2 lb

## 2019-03-14 DIAGNOSIS — I83811 Varicose veins of right lower extremities with pain: Secondary | ICD-10-CM | POA: Diagnosis not present

## 2019-03-14 DIAGNOSIS — R1013 Epigastric pain: Secondary | ICD-10-CM | POA: Diagnosis not present

## 2019-03-14 NOTE — Patient Instructions (Signed)
Chronic Venous Insufficiency Chronic venous insufficiency is a condition where the leg veins cannot effectively pump blood from the legs to the heart. This happens when the vein walls are either stretched, weakened, or damaged, or when the valves inside the vein are damaged. With the right treatment, you should be able to continue with an active life. This condition is also called venous stasis. What are the causes? Common causes of this condition include:  High blood pressure inside the veins (venous hypertension).  Sitting or standing too long, causing increased blood pressure in the leg veins.  A blood clot that blocks blood flow in a vein (deep vein thrombosis, DVT).  Inflammation of a vein (phlebitis) that causes a blood clot to form.  Tumors in the pelvis that cause blood to back up. What increases the risk? The following factors may make you more likely to develop this condition:  Having a family history of this condition.  Obesity.  Pregnancy.  Living without enough regular physical activity or exercise (sedentary lifestyle).  Smoking.  Having a job that requires long periods of standing or sitting in one place.  Being a certain age. Women in their 40s and 50s and men in their 70s are more likely to develop this condition. What are the signs or symptoms? Symptoms of this condition include:  Veins that are enlarged, bulging, or twisted (varicose veins).  Skin breakdown or ulcers.  Reddened skin or dark discoloration of skin on the leg between the knee and ankle.  [place], smooth, tight, and painful skin just above the ankle, usually on the inside of the leg (lipodermatosclerosis).  Swelling of the legs. How is this diagnosed? This condition may be diagnosed based on:  Your medical history.  A physical exam.  Tests, such as: ? A procedure that creates an image of a blood vessel and nearby organs and provides information about blood flow through the blood vessel  (duplex ultrasound). ? A procedure that tests blood flow (plethysmography). ? A procedure that looks at the veins using X-ray and dye (venogram). How is this treated? The goals of treatment are to help you return to an active life and to minimize pain or disability. Treatment depends on the severity of your condition, and it may include:  Wearing compression stockings. These can help relieve symptoms and help prevent your condition from getting worse. However, they do not cure the condition.  Sclerotherapy. This procedure involves an injection of a solution that shrinks damaged veins.  Surgery. This may involve: ? Removing a diseased vein (vein stripping). ? Cutting off blood flow through the vein (laser ablation surgery). ? Repairing or reconstructing a valve within the affected vein. Follow these instructions at home:      Wear compression stockings as told by your health care provider. These stockings help to prevent blood clots and reduce swelling in your legs.  Take over-the-counter and prescription medicines only as told by your health care provider.  Stay active by exercising, walking, or doing different activities. Ask your health care provider what activities are safe for you and how much exercise you need.  Drink enough fluid to keep your urine pale yellow.  Do not use any products that contain nicotine or tobacco, such as cigarettes, e-cigarettes, and chewing tobacco. If you need help quitting, ask your health care provider.  Keep all follow-up visits as told by your health care provider. This is important. Contact a health care provider if you:  Have redness, swelling, or more pain   in the affected area.  See a red streak or line that goes up or down from the affected area.  Have skin breakdown or skin loss in the affected area, even if the breakdown is small.  Get an injury in the affected area. Get help right away if:  You get an injury and an open wound in the  affected area.  You have: ? Severe pain that does not get better with medicine. ? Sudden numbness or weakness in the foot or ankle below the affected area. ? Trouble moving your foot or ankle. ? A fever. ? Worse or persistent symptoms. ? Chest pain. ? Shortness of breath. Summary  Chronic venous insufficiency is a condition where the leg veins cannot effectively pump blood from the legs to the heart.  Chronic venous insufficiency occurs when the vein walls become stretched, weakened, or damaged, or when valves within the vein are damaged.  Treatment depends on how severe your condition is. It often involves wearing compression stockings and may involve having a procedure.  Make sure you stay active by exercising, walking, or doing different activities. Ask your health care provider what activities are safe for you and how much exercise you need. This information is not intended to replace advice given to you by your health care provider. Make sure you discuss any questions you have with your health care provider. Document Released: 08/16/2006 Document Revised: 01/03/2018 Document Reviewed: 01/03/2018 Elsevier Patient Education  2020 [employer].  Nonsurgical Procedures for Varicose Veins Various nonsurgical procedures can be used to treat varicose veins. Varicose veins are swollen, twisted veins that are visible under the skin. They occur most often in the legs. These veins may appear blue and bulging. Varicose veins are caused by damage to the valves in veins. All veins have a valve that makes blood flow in only one direction. If a valve gets weak or damaged, blood can pool and cause varicose veins. You may need a procedure to treat your varicose veins if they are causing symptoms or complications, or if lifestyle changes have not helped. These procedures can reduce pain, aching, and the risk of bleeding and blood clots. They can also improve the way the affected area looks (cosmetic  appearance). The three common nonsurgical procedures are:  Sclerotherapy. A chemical is injected to close off a vein.  Laser treatment. Light energy is applied to close off the vein.  Radiofrequency vein ablation. Electrical energy is used to produce heat that closes off the vein. Your health care provider will discuss the method that is best for you based on your condition. Tell a health care provider about:  Any allergies you have.  All medicines you are taking, including vitamins, herbs, eye drops, creams, and over-the-counter medicines.  Any problems you or family members have had with anesthetic medicines.  Any blood disorders you have.  Any surgeries you have had.  Any medical conditions you have.  Whether you are pregnant or may be pregnant. What are the risks? Generally, this is a safe procedure. However, problems may occur, including:  Damage to nearby nerves, tissues, or veins.  Skin irritation, sores, or dark spots.  Numbness.  Clotting.  Infection.  Allergic reactions to medicines.  Scarring.  Leg swelling.  Need for additional treatments.  Bruising. What happens before the procedure?  Ask your health care provider about: ? Changing or stopping your regular medicines. This is especially important if you are taking diabetes medicines or blood thinners. ? Taking over-the-counter medicines,  vitamins, herbs, and supplements. ? Taking medicines such as aspirin and ibuprofen. These medicines can thin your blood. Do not take these medicines unless your health care provider tells you to take them.  You may have an exam or testing. This can include a tests to: ? Check for clots and check blood flow using sound waves (Doppler ultrasound). ? Observe how blood flows through your veins by injecting a dye that outlines your veins on X-rays (angiogram). This test is used in rare cases. What happens during the procedure? One of the following procedures will be  performed: Sclerotherapy This procedure is often used for small to medium veins.  A chemical (sclerosant) that irritates the lining of the vein will be injected into the vein. This will cause the varicose vein to be closed off. Sclerosants in different amounts and strengths can be used, depending on the size and location of the vein.  All of the varicose vein sites will be injected. You may need more than one treatment because new varicose veins may develop, or more than one injection may be needed for each varicose vein.  Laser treatment There are two ways that lasers are used to treat varicose veins:  Light energy from a laser may be directed onto the vein through the skin.  A needle may be used to pass a thin laser catheter into the vein to cause it to close. You may need more than one treatment if the vein re-opens. In some cases, laser treatment may be combined with sclerotherapy. Radiofrequency vein ablation   You will be given a medicine that numbs the area (local anesthetic).  A small incision will be made near the varicose vein.  A thin tube (catheter) will be threaded into your vein.  The tip of the catheter will deploy electrodes.  The electrodes will deliver electrical energy to produce heat that closes off the vein. What happens after the procedure?  A bandage (dressing) may be used to cover the injection site or incisions.  You may have to wear compression stockings. These stockings help to prevent blood clots and reduce swelling in your legs.  Return to your normal activities as told by your health care provider. Summary  Varicose veins are swollen, twisted veins that are visible under the skin. They occur most often in the legs.  Various procedures can be used to treat varicose veins. You may need a procedure to treat your varicose veins if they are causing symptoms or complications, or if lifestyle changes have not helped.  Your health care provider will  discuss the method that is best for you based on your condition. This information is not intended to replace advice given to you by your health care provider. Make sure you discuss any questions you have with your health care provider. Document Released: 07/23/2016 Document Revised: 08/04/2018 Document Reviewed: 07/23/2016 Elsevier Patient Education  2020 [employer].

## 2019-03-15 ENCOUNTER — Ambulatory Visit: Admit: 2019-03-15 | Payer: 59 | Admitting: Gastroenterology

## 2019-03-15 SURGERY — ESOPHAGOGASTRODUODENOSCOPY (EGD) WITH PROPOFOL
Anesthesia: General

## 2019-03-18 ENCOUNTER — Encounter (INDEPENDENT_AMBULATORY_CARE_PROVIDER_SITE_OTHER): Payer: Self-pay | Admitting: Nurse Practitioner

## 2019-03-18 DIAGNOSIS — I83811 Varicose veins of right lower extremities with pain: Secondary | ICD-10-CM | POA: Insufficient documentation

## 2019-03-18 NOTE — Progress Notes (Signed)
SUBJECTIVE:  Patient ID: Alison Hunt, female    DOB: 06-25-1980, 38 y.o.   MRN: [id number] Chief Complaint  Patient presents with  . New Patient (Initial Visit)    ref [name] for varicose veins    HPI  Alison Hunt is a 38 y.o. female The patient is seen for evaluation of symptomatic varicose veins. The patient relates burning and stinging which worsened steadily throughout the course of the day, particularly with standing. The patient also notes an aching and throbbing pain over the varicosities, particularly with prolonged dependent positions. The symptoms are significantly improved with elevation.  The patient also notes that during hot weather the symptoms are greatly intensified. The patient states the pain from the varicose veins interferes with work, daily exercise, shopping and household maintenance. At this point, the symptoms are persistent and severe enough that they're having a negative impact on lifestyle and are interfering with daily activities.  There is no history of DVT, PE or superficial thrombophlebitis. There is no history of ulceration or hemorrhage. The patient denies a significant family history of varicose veins. OB history: G1P1002  The patient does note that this became acutely worse with pregnancy.  The patient has not worn graduated compression in the past. At the present time the patient has not been using over-the-counter analgesics. There is no history of prior surgical intervention or sclerotherapy.  The patient underwent noninvasive studies today.  The patient has reflux in her common femoral vein, popliteal vein.  She also has reflux in her great saphenous vein at the saphenofemoral junction down to the level of her great saphenous vein at the knee.    Past Medical History:  Diagnosis Date  . Acid reflux    omeprazole prescribed by GI, pt produces too much acid  . Wears contact lenses     Past Surgical History:  Procedure  Laterality Date  . CESAREAN SECTION    . ESOPHAGOGASTRODUODENOSCOPY (EGD) WITH PROPOFOL N/A 02/21/2019   Procedure: ESOPHAGOGASTRODUODENOSCOPY (EGD) WITH PROPOFOL;  Surgeon: Toney Reil, MD;  Location: [facility];  Service: Endoscopy;  Laterality: N/A;    Social History   Socioeconomic History  . Marital status: Married    Spouse name: Not on file  . Number of children: Not on file  . Years of education: Not on file  . Highest education level: Not on file  Occupational History  . Not on file  Social Needs  . Financial resource strain: Not on file  . Food insecurity    Worry: Not on file    Inability: Not on file  . Transportation needs    Medical: Not on file    Non-medical: Not on file  Tobacco Use  . Smoking status: Never Smoker  . Smokeless tobacco: Never Used  Substance and Sexual Activity  . Alcohol use: Not Currently  . Drug use: Never  . Sexual activity: Yes    Birth control/protection: Pill  Lifestyle  . Physical activity    Days per week: Not on file    Minutes per session: Not on file  . Stress: Not on file  Relationships  . Social Musician on phone: Not on file    Gets together: Not on file    Attends religious service: Not on file    Active member of club or organization: Not on file    Attends meetings of clubs or organizations: Not on file    Relationship status: Not on file  .  Intimate partner violence    Fear of current or ex partner: Not on file    Emotionally abused: Not on file    Physically abused: Not on file    Forced sexual activity: Not on file  Other Topics Concern  . Not on file  Social History Narrative  . Not on file    Family History  Problem Relation Age of Onset  . Breast cancer Neg Hx   . Ovarian cancer Neg Hx     Allergies  Allergen Reactions  . Tomato Nausea Only    Mouth ulcers     Review of Systems   Review of Systems: Negative Unless Checked Constitutional: [] Weight loss  [] Fever   [] Chills Cardiac: [] Chest pain   []  Atrial Fibrillation  [] Palpitations   [] Shortness of breath when laying flat   [] Shortness of breath with exertion. [] Shortness of breath at rest Vascular:  [] Pain in legs with walking   [] Pain in legs with standing [] Pain in legs when laying flat   [] Claudication    [] Pain in feet when laying flat    [] History of DVT   [] Phlebitis   [] Swelling in legs   [x] Varicose veins   [] Non-healing ulcers Pulmonary:   [] Uses home oxygen   [] Productive cough   [] Hemoptysis   [] Wheeze  [] COPD   [] Asthma Neurologic:  [] Dizziness   [] Seizures  [] Blackouts [] History of stroke   [] History of TIA  [] Aphasia   [] Temporary Blindness   [] Weakness or numbness in arm   [] Weakness or numbness in leg Musculoskeletal:   [] Joint swelling   [] Joint pain   [] Low back pain  []  History of Knee Replacement [] Arthritis [] back Surgeries  []  Spinal Stenosis    Hematologic:  [] Easy bruising  [] Easy bleeding   [] Hypercoagulable state   [] Anemic Gastrointestinal:  [] Diarrhea   [] Vomiting  [x] Gastroesophageal reflux/heartburn   [] Difficulty swallowing. [] Abdominal pain Genitourinary:  [] Chronic kidney disease   [] Difficult urination  [] Anuric   [] Blood in urine [] Frequent urination  [] Burning with urination   [] Hematuria Skin:  [] Rashes   [] Ulcers [] Wounds Psychological:  [] History of anxiety   []  History of major depression  []  Memory Difficulties      OBJECTIVE:   Physical Exam  BP 107/71 (BP Location: Right Arm)   Pulse 69   Resp 16   Wt 158 lb 3.2 oz (71.8 kg)   BMI 25.53 kg/m   Gen: WD/WN, NAD Head: [place]/AT, No temporalis wasting.  Ear/Nose/Throat: Hearing grossly intact, nares w/o erythema or drainage Eyes: PER, EOMI, sclera nonicteric.  Neck: Supple, no masses.  No JVD.  Pulmonary:  Good air movement, no use of accessory muscles.  Cardiac: RRR Vascular:  Varicose veins in right lower extremity Vessel Right Left  Radial Palpable Palpable  Dorsalis Pedis Palpable Palpable   Posterior Tibial Palpable Palpable   Gastrointestinal: soft, non-distended. No guarding/no peritoneal signs.  Musculoskeletal: M/S 5/5 throughout.  No deformity or atrophy.  Neurologic: Pain and light touch intact in extremities.  Symmetrical.  Speech is fluent. Motor exam as listed above. Psychiatric: Judgment intact, Mood & affect appropriate for pt's clinical situation. Dermatologic: No Venous rashes. No Ulcers Noted.  No changes consistent with cellulitis. Lymph : No Cervical lymphadenopathy, no lichenification or skin changes of chronic lymphedema.       ASSESSMENT AND PLAN:  1. Varicose veins of leg with pain, right  Recommend:  The patient has large symptomatic varicose veins that are painful and associated with swelling.  I have had a long  discussion with the patient regarding  varicose veins and why they cause symptoms.  Patient will begin wearing graduated compression stockings class 1 on a daily basis, beginning first thing in the morning and removing them in the evening. The patient is instructed specifically not to sleep in the stockings.    The patient  will also begin using over-the-counter analgesics such as Motrin 600 mg po TID to help control the symptoms.    In addition, behavioral modification including elevation during the day will be initiated.    Pending the results of these changes the  patient will be reevaluated in three months.   An  ultrasound of the venous system will be obtained.   Further plans will be based on the ultrasound results and whether conservative therapies are successful at eliminating the pain and swelling.   2. Dyspepsia Continue PPI as already ordered, this medication has been reviewed and there are no changes at this time.  Avoidence of caffeine and alcohol  Moderate elevation of the head of the bed    Current Outpatient Medications on File Prior to Visit  Medication Sig Dispense Refill  . fluticasone (FLONASE) 50 MCG/ACT nasal  spray Place 2 sprays into both nostrils daily.  3  . KARIVA 0.15-0.02/0.01 MG (21/5) tablet Take 1 tablet by mouth daily for 28 days. 3 Package 3  . Multiple Vitamin (MULTIVITAMIN PO) Take by mouth daily.    Marland Kitchen omeprazole (PRILOSEC) 20 MG capsule TAKE 1 CAPSULE 2 TIMES DAILY BEFORE A MEAL (30 MINUTES PRIOR TO BREAKFAST AND BEDTIME) 60 capsule 0   No current facility-administered medications on file prior to visit.     Patient Instructions  Chronic Venous Insufficiency Chronic venous insufficiency is a condition where the leg veins cannot effectively pump blood from the legs to the heart. This happens when the vein walls are either stretched, weakened, or damaged, or when the valves inside the vein are damaged. With the right treatment, you should be able to continue with an active life. This condition is also called venous stasis. What are the causes? Common causes of this condition include:  High blood pressure inside the veins (venous hypertension).  Sitting or standing too long, causing increased blood pressure in the leg veins.  A blood clot that blocks blood flow in a vein (deep vein thrombosis, DVT).  Inflammation of a vein (phlebitis) that causes a blood clot to form.  Tumors in the pelvis that cause blood to back up. What increases the risk? The following factors may make you more likely to develop this condition:  Having a family history of this condition.  Obesity.  Pregnancy.  Living without enough regular physical activity or exercise (sedentary lifestyle).  Smoking.  Having a job that requires long periods of standing or sitting in one place.  Being a certain age. Women in their 16s and 74s and men in their 48s are more likely to develop this condition. What are the signs or symptoms? Symptoms of this condition include:  Veins that are enlarged, bulging, or twisted (varicose veins).  Skin breakdown or ulcers.  Reddened skin or dark discoloration of skin on  the leg between the knee and ankle.  Marico Buckle, smooth, tight, and painful skin just above the ankle, usually on the inside of the leg (lipodermatosclerosis).  Swelling of the legs. How is this diagnosed? This condition may be diagnosed based on:  Your medical history.  A physical exam.  Tests, such as: ? A procedure that creates an  image of a blood vessel and nearby organs and provides information about blood flow through the blood vessel (duplex ultrasound). ? A procedure that tests blood flow (plethysmography). ? A procedure that looks at the veins using X-ray and dye (venogram). How is this treated? The goals of treatment are to help you return to an active life and to minimize pain or disability. Treatment depends on the severity of your condition, and it may include:  Wearing compression stockings. These can help relieve symptoms and help prevent your condition from getting worse. However, they do not cure the condition.  Sclerotherapy. This procedure involves an injection of a solution that shrinks damaged veins.  Surgery. This may involve: ? Removing a diseased vein (vein stripping). ? Cutting off blood flow through the vein (laser ablation surgery). ? Repairing or reconstructing a valve within the affected vein. Follow these instructions at home:      Wear compression stockings as told by your health care provider. These stockings help to prevent blood clots and reduce swelling in your legs.  Take over-the-counter and prescription medicines only as told by your health care provider.  Stay active by exercising, walking, or doing different activities. Ask your health care provider what activities are safe for you and how much exercise you need.  Drink enough fluid to keep your urine pale yellow.  Do not use any products that contain nicotine or tobacco, such as cigarettes, e-cigarettes, and chewing tobacco. If you need help quitting, ask your health care provider.  Keep  all follow-up visits as told by your health care provider. This is important. Contact a health care provider if you:  Have redness, swelling, or more pain in the affected area.  See a red streak or line that goes up or down from the affected area.  Have skin breakdown or skin loss in the affected area, even if the breakdown is small.  Get an injury in the affected area. Get help right away if:  You get an injury and an open wound in the affected area.  You have: ? Severe pain that does not get better with medicine. ? Sudden numbness or weakness in the foot or ankle below the affected area. ? Trouble moving your foot or ankle. ? A fever. ? Worse or persistent symptoms. ? Chest pain. ? Shortness of breath. Summary  Chronic venous insufficiency is a condition where the leg veins cannot effectively pump blood from the legs to the heart.  Chronic venous insufficiency occurs when the vein walls become stretched, weakened, or damaged, or when valves within the vein are damaged.  Treatment depends on how severe your condition is. It often involves wearing compression stockings and may involve having a procedure.  Make sure you stay active by exercising, walking, or doing different activities. Ask your health care provider what activities are safe for you and how much exercise you need. This information is not intended to replace advice given to you by your health care provider. Make sure you discuss any questions you have with your health care provider. Document Released: 08/16/2006 Document Revised: 01/03/2018 Document Reviewed: 01/03/2018 Elsevier Patient Education  2020 [employer]  Nonsurgical Procedures for Varicose Veins Various nonsurgical procedures can be used to treat varicose veins. Varicose veins are swollen, twisted veins that are visible under the skin. They occur most often in the legs. These veins may appear blue and bulging. Varicose veins are caused by damage to the  valves in veins. All veins have a valve  that makes blood flow in only one direction. If a valve gets weak or damaged, blood can pool and cause varicose veins. You may need a procedure to treat your varicose veins if they are causing symptoms or complications, or if lifestyle changes have not helped. These procedures can reduce pain, aching, and the risk of bleeding and blood clots. They can also improve the way the affected area looks (cosmetic appearance). The three common nonsurgical procedures are:  Sclerotherapy. A chemical is injected to close off a vein.  Laser treatment. Light energy is applied to close off the vein.  Radiofrequency vein ablation. Electrical energy is used to produce heat that closes off the vein. Your health care provider will discuss the method that is best for you based on your condition. Tell a health care provider about:  Any allergies you have.  All medicines you are taking, including vitamins, herbs, eye drops, creams, and over-the-counter medicines.  Any problems you or family members have had with anesthetic medicines.  Any blood disorders you have.  Any surgeries you have had.  Any medical conditions you have.  Whether you are pregnant or may be pregnant. What are the risks? Generally, this is a safe procedure. However, problems may occur, including:  Damage to nearby nerves, tissues, or veins.  Skin irritation, sores, or dark spots.  Numbness.  Clotting.  Infection.  Allergic reactions to medicines.  Scarring.  Leg swelling.  Need for additional treatments.  Bruising. What happens before the procedure?  Ask your health care provider about: ? Changing or stopping your regular medicines. This is especially important if you are taking diabetes medicines or blood thinners. ? Taking over-the-counter medicines, vitamins, herbs, and supplements. ? Taking medicines such as aspirin and ibuprofen. These medicines can thin your blood. Do not  take these medicines unless your health care provider tells you to take them.  You may have an exam or testing. This can include a tests to: ? Check for clots and check blood flow using sound waves (Doppler ultrasound). ? Observe how blood flows through your veins by injecting a dye that outlines your veins on X-rays (angiogram). This test is used in rare cases. What happens during the procedure? One of the following procedures will be performed: Sclerotherapy This procedure is often used for small to medium veins.  A chemical (sclerosant) that irritates the lining of the vein will be injected into the vein. This will cause the varicose vein to be closed off. Sclerosants in different amounts and strengths can be used, depending on the size and location of the vein.  All of the varicose vein sites will be injected. You may need more than one treatment because new varicose veins may develop, or more than one injection may be needed for each varicose vein.  Laser treatment There are two ways that lasers are used to treat varicose veins:  Light energy from a laser may be directed onto the vein through the skin.  A needle may be used to pass a thin laser catheter into the vein to cause it to close. You may need more than one treatment if the vein re-opens. In some cases, laser treatment may be combined with sclerotherapy. Radiofrequency vein ablation   You will be given a medicine that numbs the area (local anesthetic).  A small incision will be made near the varicose vein.  A thin tube (catheter) will be threaded into your vein.  The tip of the catheter will deploy electrodes.  The  electrodes will deliver electrical energy to produce heat that closes off the vein. What happens after the procedure?  A bandage (dressing) may be used to cover the injection site or incisions.  You may have to wear compression stockings. These stockings help to prevent blood clots and reduce swelling in  your legs.  Return to your normal activities as told by your health care provider. Summary  Varicose veins are swollen, twisted veins that are visible under the skin. They occur most often in the legs.  Various procedures can be used to treat varicose veins. You may need a procedure to treat your varicose veins if they are causing symptoms or complications, or if lifestyle changes have not helped.  Your health care provider will discuss the method that is best for you based on your condition. This information is not intended to replace advice given to you by your health care provider. Make sure you discuss any questions you have with your health care provider. Document Released: 07/23/2016 Document Revised: 08/04/2018 Document Reviewed: 07/23/2016 Elsevier Patient Education  2020 [employer].   Return in about 3 months (around 06/14/2019) for Varicose veins.   Georgiana Spinner, NP  This note was completed with Office manager.  Any errors are purely unintentional.

## 2019-04-24 ENCOUNTER — Telehealth: Payer: Self-pay

## 2019-04-24 LAB — ALPHA-GAL PANEL
Alpha Gal IgE*: 0.25 kU/L — ABNORMAL HIGH (ref <0.10)
Beef (Bos spp) IgE: 0.1 kU/L (ref <0.35)
Class Interpretation: 0
Class Interpretation: 0
Lamb/Mutton (Ovis spp) IgE: <0.1 kU/L (ref <0.35)
Pork (Sus spp) IgE: <0.1 kU/L (ref <0.35)

## 2019-04-24 NOTE — Telephone Encounter (Signed)
Pt has been notified of results and verbalized understanding  

## 2019-05-23 ENCOUNTER — Other Ambulatory Visit: Payer: Self-pay

## 2019-05-23 DIAGNOSIS — Z3041 Encounter for surveillance of contraceptive pills: Secondary | ICD-10-CM

## 2019-05-23 MED ORDER — KARIVA 0.15-0.02/0.01 MG (21/5) PO TABS: 1.0000 | ORAL_TABLET | Freq: Every day | ORAL | 0 refills | Status: DC

## 2019-05-23 NOTE — Telephone Encounter (Signed)
Pt calling for refill of bc Alison Hunt to [facility].  Appt sched 06/28/19.  [phone] Pt aware eRx'd.

## 2019-06-19 ENCOUNTER — Ambulatory Visit (INDEPENDENT_AMBULATORY_CARE_PROVIDER_SITE_OTHER): Payer: 59 | Admitting: Vascular Surgery

## 2019-06-28 ENCOUNTER — Ambulatory Visit: Payer: 59 | Admitting: Obstetrics and Gynecology

## 2019-08-03 ENCOUNTER — Other Ambulatory Visit: Payer: Self-pay

## 2019-08-03 DIAGNOSIS — Z3041 Encounter for surveillance of contraceptive pills: Secondary | ICD-10-CM

## 2019-08-03 MED ORDER — KARIVA 0.15-0.02/0.01 MG (21/5) PO TABS: 1.0000 | ORAL_TABLET | Freq: Every day | ORAL | 0 refills | Status: DC

## 2019-08-03 NOTE — Telephone Encounter (Signed)
PT left msg on triage asking for one more BC RF. She had to reschedule her annual due to exposure to covid. [facility] RF sent, pt aware.

## 2019-08-06 ENCOUNTER — Ambulatory Visit: Payer: 59 | Admitting: Obstetrics and Gynecology

## 2019-08-28 ENCOUNTER — Other Ambulatory Visit: Payer: Self-pay

## 2019-08-28 ENCOUNTER — Encounter: Payer: Self-pay | Admitting: Obstetrics and Gynecology

## 2019-08-28 ENCOUNTER — Ambulatory Visit (INDEPENDENT_AMBULATORY_CARE_PROVIDER_SITE_OTHER): Payer: 59 | Admitting: Obstetrics and Gynecology

## 2019-08-28 VITALS — BP 110/80 | Ht 66.0 in | Wt 166.0 lb

## 2019-08-28 DIAGNOSIS — Z01419 Encounter for gynecological examination (general) (routine) without abnormal findings: Secondary | ICD-10-CM | POA: Diagnosis not present

## 2019-08-28 DIAGNOSIS — Z3041 Encounter for surveillance of contraceptive pills: Secondary | ICD-10-CM | POA: Diagnosis not present

## 2019-08-28 MED ORDER — DESOGESTREL-ETHINYL ESTRADIOL 0.15-0.02/0.01 MG (21/5) PO TABS: 1.0000 | ORAL_TABLET | Freq: Every day | ORAL | 3 refills | Status: DC

## 2019-08-28 NOTE — Patient Instructions (Signed)
I value your feedback and entrusting us with your care. If you get a [facility] patient survey, I would appreciate you taking the time to let us know about your experience today. Thank you!  As of April 05, 2019, your lab results will be released to your MyChart immediately, before I even have a chance to see them. Please give me time to review them and contact you if there are any abnormalities. Thank you for your patience.  

## 2019-08-28 NOTE — Progress Notes (Signed)
PCP:  Mick Sell, MD   Chief Complaint  Patient presents with  . Gynecologic Exam     HPI:      [name] is a 39 y.o. No obstetric history on file. who LMP was Patient's last menstrual period was 08/11/2019 (approximate)., presents today for her annual examination.  Her menses are regular every 28-30 days, lasting 4 days.  Dysmenorrhea mild. She does not have intermenstrual bleeding.  Sex activity: single partner, contraception - OCP (estrogen/progesterone).  Last Pap: 06/27/18 Results: no abnormalities/neg HPV NDA; no hx of abn Hx of STDs: none  There is no FH of breast cancer. There is no FH of ovarian cancer. The patient does do self-breast exams.  Tobacco use: The patient denies current or previous tobacco use. Alcohol use: social drinker No drug use.  Exercise: moderately active  She does get adequate calcium but not Vitamin D in her diet.   Past Medical History:  Diagnosis Date  . Acid reflux    omeprazole prescribed by GI, pt produces too much acid  . Wears contact lenses     Past Surgical History:  Procedure Laterality Date  . CESAREAN SECTION    . ESOPHAGOGASTRODUODENOSCOPY (EGD) WITH PROPOFOL N/A 02/21/2019   Procedure: ESOPHAGOGASTRODUODENOSCOPY (EGD) WITH PROPOFOL;  Surgeon: Toney Reil, MD;  Location: [facility];  Service: Endoscopy;  Laterality: N/A;    Family History  Problem Relation Age of Onset  . Breast cancer Neg Hx   . Ovarian cancer Neg Hx     Social History   Socioeconomic History  . Marital status: Married    Spouse name: Not on file  . Number of children: Not on file  . Years of education: Not on file  . Highest education level: Not on file  Occupational History  . Not on file  Tobacco Use  . Smoking status: Never Smoker  . Smokeless tobacco: Never Used  Substance and Sexual Activity  . Alcohol use: Not Currently  . Drug use: Never  . Sexual activity: Yes    Birth control/protection:  Pill  Other Topics Concern  . Not on file  Social History Narrative  . Not on file   Social Determinants of Health   Financial Resource Strain:   . Difficulty of Paying Living Expenses:   Food Insecurity:   . Worried About Programme researcher, broadcasting/film/video in the Last Year:   . Barista in the Last Year:   Transportation Needs:   . Freight forwarder (Medical):   Marland Kitchen Lack of Transportation (Non-Medical):   Physical Activity:   . Days of Exercise per Week:   . Minutes of Exercise per Session:   Stress:   . Feeling of Stress :   Social Connections:   . Frequency of Communication with Friends and Family:   . Frequency of Social Gatherings with Friends and Family:   . Attends Religious Services:   . Active Member of Clubs or Organizations:   . Attends Banker Meetings:   Marland Kitchen Marital Status:   Intimate Partner Violence:   . Fear of Current or Ex-Partner:   . Emotionally Abused:   Marland Kitchen Physically Abused:   . Sexually Abused:     Outpatient Medications Prior to Visit  Medication Sig Dispense Refill  . fluticasone (FLONASE) 50 MCG/ACT nasal spray Place 2 sprays into both nostrils daily.  3  . Multiple Vitamin (MULTIVITAMIN PO) Take by mouth daily.    Marland Kitchen omeprazole (PRILOSEC)  40 MG capsule Take 40 mg by mouth 2 (two) times daily.    Marland Kitchen KARIVA 0.15-0.02/0.01 MG (21/5) tablet Take 1 tablet by mouth daily for 28 days. 3 Package 0  . omeprazole (PRILOSEC) 20 MG capsule TAKE 1 CAPSULE 2 TIMES DAILY BEFORE A MEAL (30 MINUTES PRIOR TO BREAKFAST AND BEDTIME) 60 capsule 0   No facility-administered medications prior to visit.      ROS:  Review of Systems  Constitutional: Negative for fatigue, fever and unexpected weight change.  Respiratory: Negative for cough, shortness of breath and wheezing.   Cardiovascular: Negative for chest pain, palpitations and leg swelling.  Gastrointestinal: Negative for blood in stool, constipation, diarrhea, nausea and vomiting.  Endocrine: Negative  for cold intolerance, heat intolerance and polyuria.  Genitourinary: Negative for dyspareunia, dysuria, flank pain, frequency, genital sores, hematuria, menstrual problem, pelvic pain, urgency, vaginal bleeding, vaginal discharge and vaginal pain.  Musculoskeletal: Negative for back pain, joint swelling and myalgias.  Skin: Negative for rash.  Neurological: Negative for dizziness, syncope, light-headedness, numbness and headaches.  Hematological: Negative for adenopathy.  Psychiatric/Behavioral: Negative for agitation, confusion, sleep disturbance and suicidal ideas. The patient is not nervous/anxious.   BREAST: No symptoms   Objective: BP 110/80   Ht 5\' 6"  (1.676 m)   Wt 166 lb (75.3 kg)   LMP 08/11/2019 (Approximate)   BMI 26.79 kg/m    Physical Exam Constitutional:      Appearance: She is well-developed.  Genitourinary:     Vulva, vagina, cervix, uterus, right adnexa and left adnexa normal.     No vulval lesion or tenderness noted.     No vaginal discharge, erythema or tenderness.     No cervical polyp.     Uterus is not enlarged or tender.     No right or left adnexal mass present.     Right adnexa not tender.     Left adnexa not tender.  Neck:     Thyroid: No thyromegaly.  Cardiovascular:     Rate and Rhythm: Normal rate and regular rhythm.     Heart sounds: Normal heart sounds. No murmur.  Pulmonary:     Effort: Pulmonary effort is normal.     Breath sounds: Normal breath sounds.  Chest:     Breasts:        Right: No mass, nipple discharge, skin change or tenderness.        Left: No mass, nipple discharge, skin change or tenderness.  Abdominal:     Palpations: Abdomen is soft.     Tenderness: There is no abdominal tenderness. There is no guarding.  Musculoskeletal:        General: Normal range of motion.     Cervical back: Normal range of motion.  Neurological:     General: No focal deficit present.     Mental Status: She is alert and oriented to person,  place, and time.     Cranial Nerves: No cranial nerve deficit.  Skin:    General: Skin is warm and dry.  Psychiatric:        Mood and Affect: Mood normal.        Behavior: Behavior normal.        Thought Content: Thought content normal.        Judgment: Judgment normal.  Vitals reviewed.     Assessment/Plan: Encounter for annual routine gynecological examination  Encounter for surveillance of contraceptive pills - OCP RF - Plan: desogestrel-ethinyl estradiol (KARIVA) 0.15-0.02/0.01 MG (21/5) tablet  Meds ordered this encounter  Medications  . desogestrel-ethinyl estradiol (KARIVA) 0.15-0.02/0.01 MG (21/5) tablet    Sig: Take 1 tablet by mouth daily for 28 days.    Dispense:  3 Package    Refill:  3    Order Specific Question:   Supervising Provider    Answer:   Gae Dry [[id number]]             GYN counsel adequate intake of calcium and vitamin D, diet and exercise     F/U  Return in about 1 year (around 08/27/2020).  Vernadette Stutsman B. Katyana Trolinger, PA-C 08/28/2019 4:23 PM

## 2020-02-29 IMAGING — US US ABDOMEN LIMITED
1 series · 14 of 25 positions shown · non-contrast
Comparison: None.

CLINICAL DATA: Right upper quadrant pain for 1 year.

EXAM:
ULTRASOUND ABDOMEN LIMITED RIGHT UPPER QUADRANT

[Series 1: us abdomen limited · 0.17mm/px · 14 of 60 slices shown]
[im 1/60]
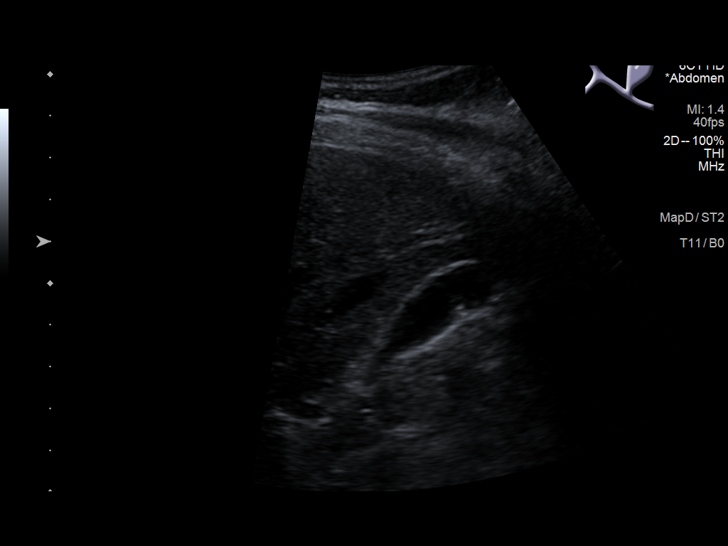
[im 5/60]
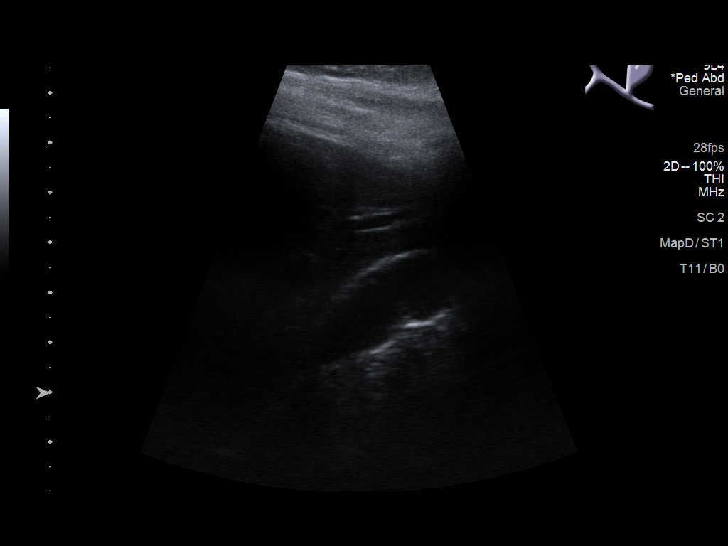
[im 10/60]
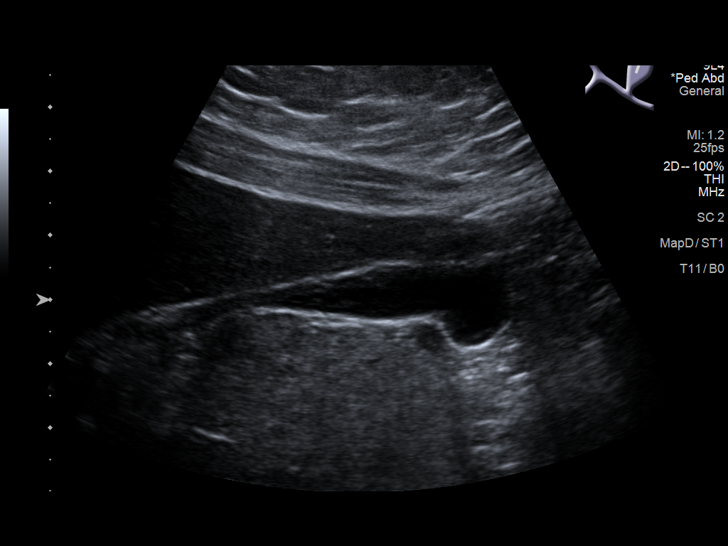
[im 15/60]
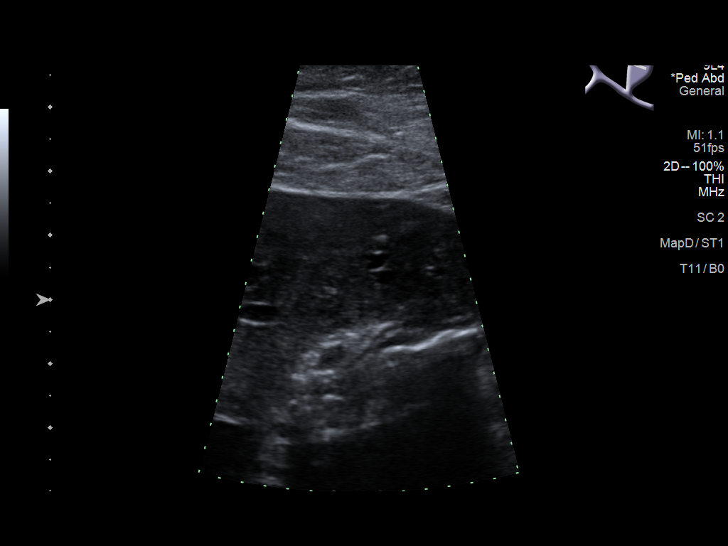
[im 20/60]
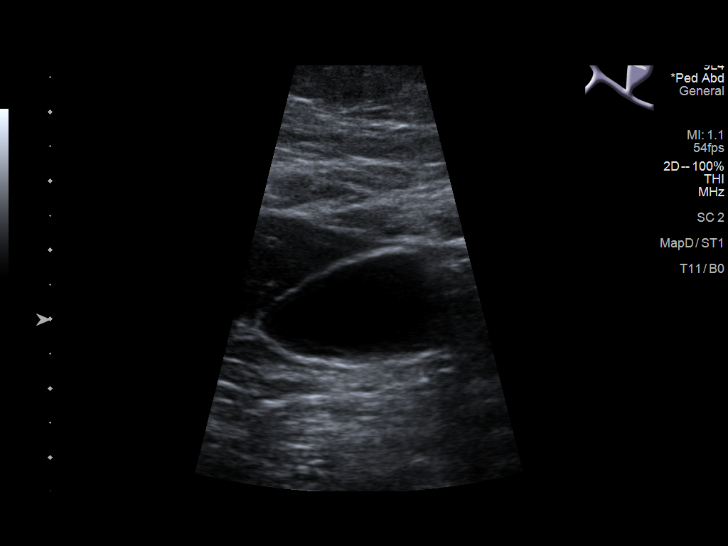
[im 23/60]
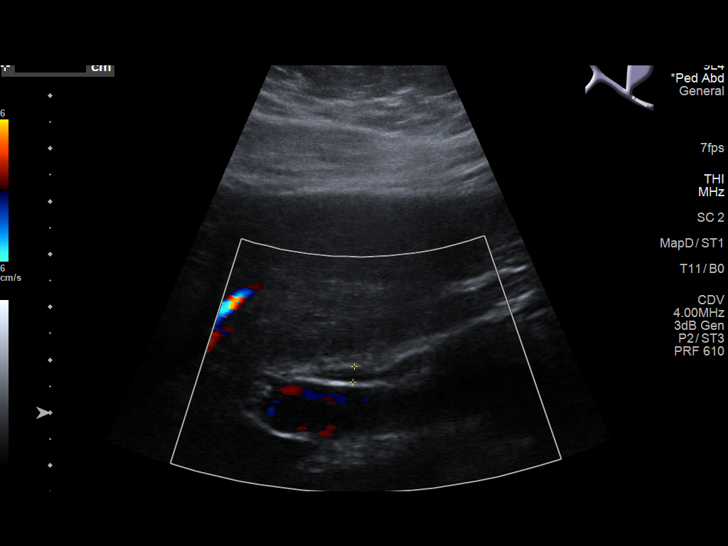
[im 28/60]
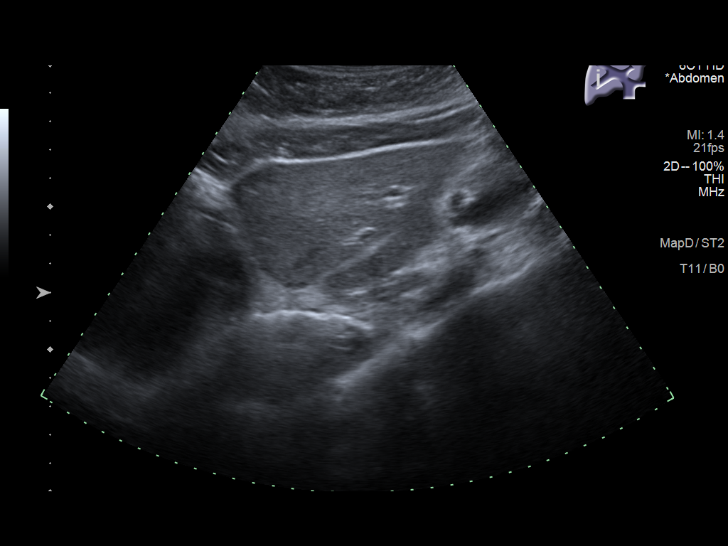
[im 32/60]
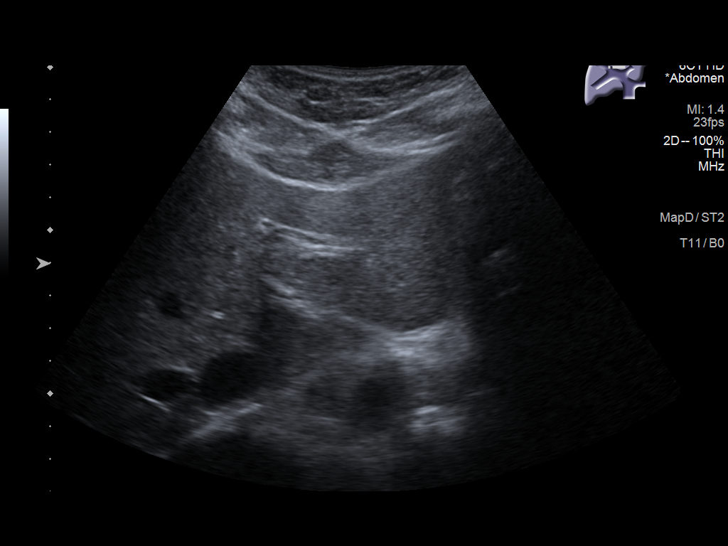
[im 37/60]
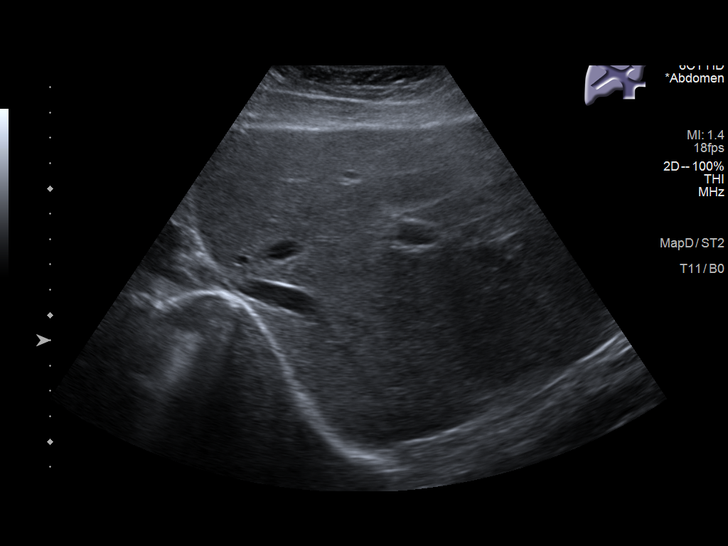
[im 40/60]
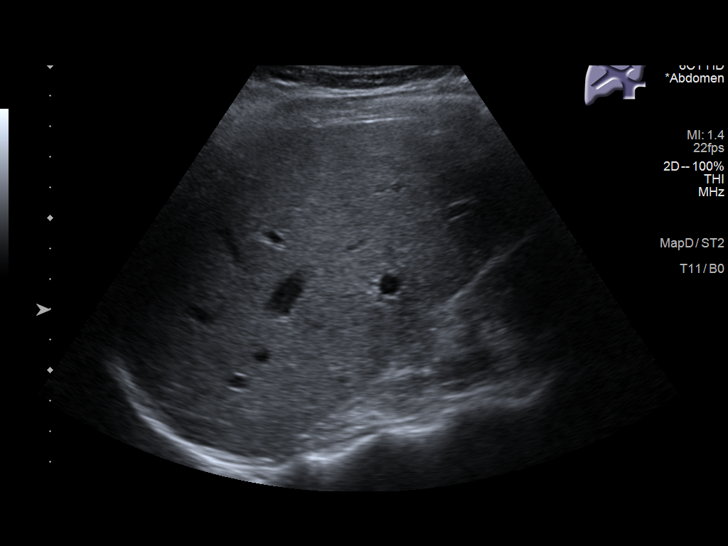
[im 45/60]
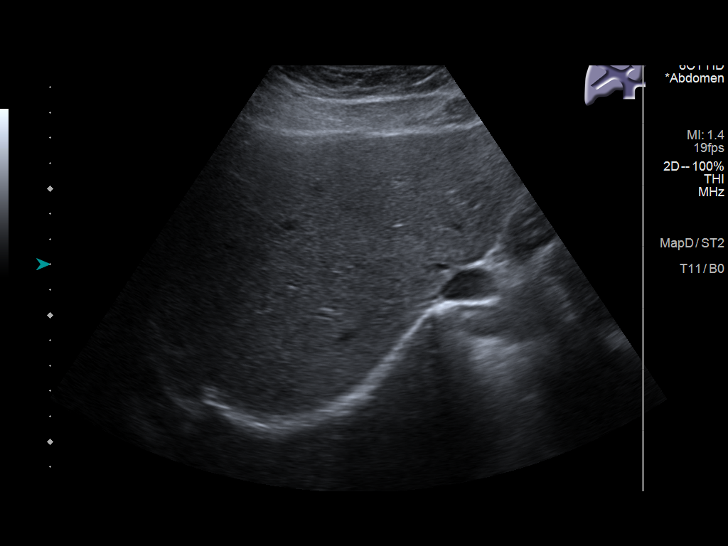
[im 50/60]
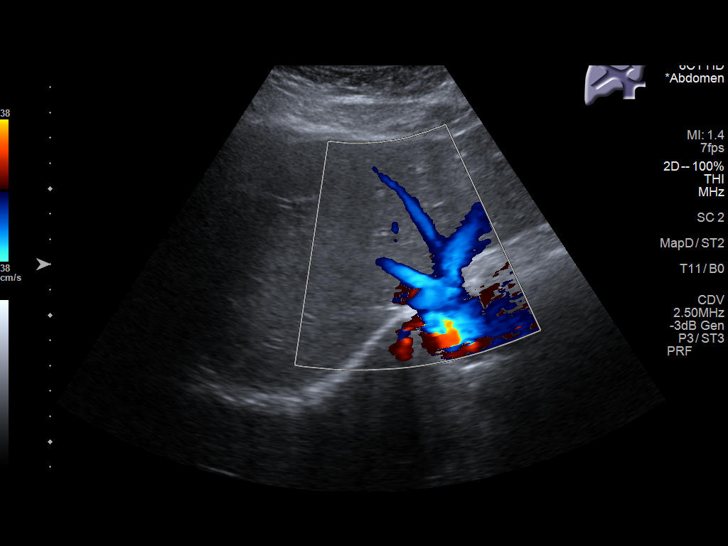
[im 55/60]
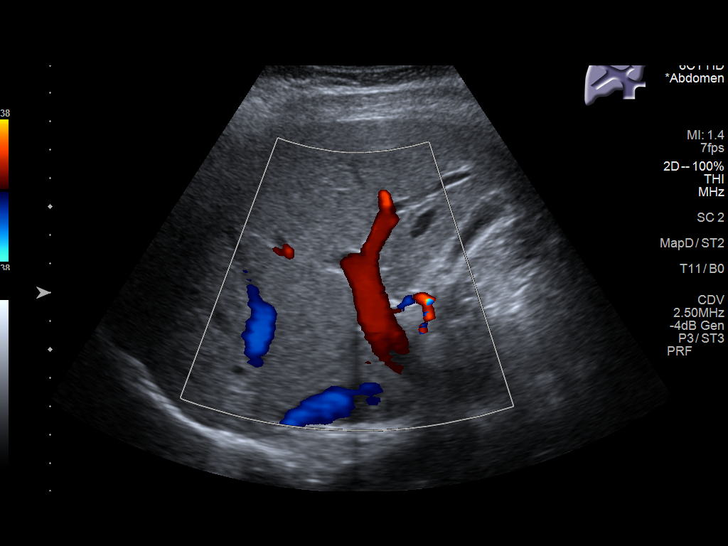
[im 60/60]
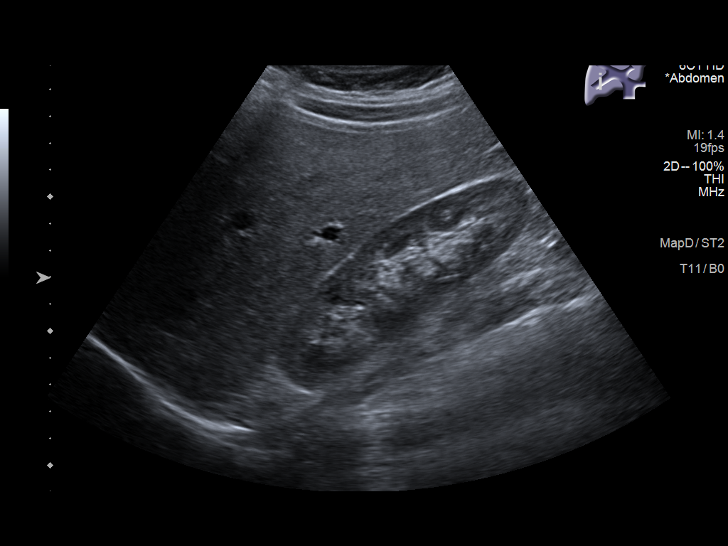

[14 of 25 positions shown; findings below may reference images not displayed]

FINDINGS: Gallbladder:

No gallstones or wall thickening visualized. No sonographic Murphy
sign noted by sonographer.

Common bile duct:

Diameter: 3 mm, within normal limits.

Liver:

No focal lesion identified. Within normal limits in parenchymal
echogenicity. Portal vein is patent on color Doppler imaging with
normal direction of blood flow towards the liver.

Other: None.
IMPRESSION: Normal exam.

## 2020-08-09 ENCOUNTER — Other Ambulatory Visit: Payer: Self-pay | Admitting: Obstetrics and Gynecology

## 2020-08-09 DIAGNOSIS — Z3041 Encounter for surveillance of contraceptive pills: Secondary | ICD-10-CM

## 2020-08-11 ENCOUNTER — Other Ambulatory Visit: Payer: Self-pay

## 2020-08-11 DIAGNOSIS — Z3041 Encounter for surveillance of contraceptive pills: Secondary | ICD-10-CM

## 2020-08-11 MED ORDER — DESOGESTREL-ETHINYL ESTRADIOL 0.15-0.02/0.01 MG (21/5) PO TABS: 1.0000 | ORAL_TABLET | Freq: Every day | ORAL | 0 refills | Status: DC

## 2020-08-11 NOTE — Telephone Encounter (Signed)
Pt calling for refill of Garnette Scheuermann; has scheduled appt for 09/03/20; [facility].  [phone]  Pt aware by vm 'refill has been sent in'.

## 2020-09-01 ENCOUNTER — Other Ambulatory Visit: Payer: Self-pay | Admitting: Obstetrics and Gynecology

## 2020-09-01 ENCOUNTER — Telehealth: Payer: Self-pay

## 2020-09-01 DIAGNOSIS — Z3041 Encounter for surveillance of contraceptive pills: Secondary | ICD-10-CM

## 2020-09-01 MED ORDER — DESOGESTREL-ETHINYL ESTRADIOL 0.15-0.02/0.01 MG (21/5) PO TABS: 1.0000 | ORAL_TABLET | Freq: Every day | ORAL | 0 refills | Status: DC

## 2020-09-01 NOTE — Telephone Encounter (Signed)
Pt calling; has sched appt; needs refill of Kariva.  [phone]  Left msg refill sent in.

## 2020-09-03 ENCOUNTER — Ambulatory Visit: Payer: 59 | Admitting: Obstetrics and Gynecology

## 2020-09-15 NOTE — Progress Notes (Signed)
PCP:  Mick Sell, MD   Chief Complaint  Patient presents with  . Gynecologic Exam    No concerns     HPI:      [name] is a 40 y.o. No obstetric history on file. who LMP was Patient's last menstrual period was 09/08/2020 (exact date)., presents today for her annual examination.  Her menses are regular every 28-30 days, lasting 2-3 days.  Dysmenorrhea mild. She does not have intermenstrual bleeding.  Sex activity: single partner, contraception - OCP (estrogen/progesterone).  Last Pap: 06/27/18 Results: no abnormalities/neg HPV NDA; no hx of abn Hx of STDs: none  There is no FH of breast cancer. There is no FH of ovarian cancer. The patient does do self-breast exams.  Tobacco use: The patient denies current or previous tobacco use. Alcohol use: social drinker No drug use.  Exercise: moderately active  She does get adequate calcium but not Vitamin D in her diet.   Past Medical History:  Diagnosis Date  . Acid reflux    omeprazole prescribed by GI, pt produces too much acid  . Wears contact lenses     Past Surgical History:  Procedure Laterality Date  . CESAREAN SECTION    . ESOPHAGOGASTRODUODENOSCOPY (EGD) WITH PROPOFOL N/A 02/21/2019   Procedure: ESOPHAGOGASTRODUODENOSCOPY (EGD) WITH PROPOFOL;  Surgeon: Toney Reil, MD;  Location: [facility];  Service: Endoscopy;  Laterality: N/A;    Family History  Problem Relation Age of Onset  . Breast cancer Neg Hx   . Ovarian cancer Neg Hx     Social History   Socioeconomic History  . Marital status: Married    Spouse name: Not on file  . Number of children: Not on file  . Years of education: Not on file  . Highest education level: Not on file  Occupational History  . Not on file  Tobacco Use  . Smoking status: Never Smoker  . Smokeless tobacco: Never Used  Vaping Use  . Vaping Use: Never used  Substance and Sexual Activity  . Alcohol use: Not Currently  . Drug use: Never   . Sexual activity: Yes    Birth control/protection: Pill  Other Topics Concern  . Not on file  Social History Narrative  . Not on file   Social Determinants of Health   Financial Resource Strain: Not on file  Food Insecurity: Not on file  Transportation Needs: Not on file  Physical Activity: Not on file  Stress: Not on file  Social Connections: Not on file  Intimate Partner Violence: Not on file    Outpatient Medications Prior to Visit  Medication Sig Dispense Refill  . cetirizine (ZYRTEC) 10 MG tablet Take by mouth.    . Multiple Vitamin (MULTIVITAMIN PO) Take by mouth daily.    Marland Kitchen omeprazole (PRILOSEC) 40 MG capsule Take 40 mg by mouth 2 (two) times daily.    Marland Kitchen desogestrel-ethinyl estradiol (KARIVA) 0.15-0.02/0.01 MG (21/5) tablet Take 1 tablet by mouth daily for 28 days. 28 tablet 0  . fluticasone (FLONASE) 50 MCG/ACT nasal spray Place 2 sprays into both nostrils daily.  3   No facility-administered medications prior to visit.      ROS:  Review of Systems  Constitutional: Negative for fatigue, fever and unexpected weight change.  Respiratory: Negative for cough, shortness of breath and wheezing.   Cardiovascular: Negative for chest pain, palpitations and leg swelling.  Gastrointestinal: Negative for blood in stool, constipation, diarrhea, nausea and vomiting.  Endocrine: Negative for  cold intolerance, heat intolerance and polyuria.  Genitourinary: Negative for dyspareunia, dysuria, flank pain, frequency, genital sores, hematuria, menstrual problem, pelvic pain, urgency, vaginal bleeding, vaginal discharge and vaginal pain.  Musculoskeletal: Negative for back pain, joint swelling and myalgias.  Skin: Negative for rash.  Neurological: Negative for dizziness, syncope, light-headedness, numbness and headaches.  Hematological: Negative for adenopathy.  Psychiatric/Behavioral: Negative for agitation, confusion, sleep disturbance and suicidal ideas. The patient is not  nervous/anxious.   BREAST: No symptoms   Objective: BP 110/80   Ht 5\' 6"  (1.676 m)   Wt 174 lb (78.9 kg)   LMP 09/08/2020 (Exact Date)   BMI 28.08 kg/m    Physical Exam Constitutional:      Appearance: She is well-developed.  Genitourinary:     Vulva normal.     Right Labia: No rash, tenderness or lesions.    Left Labia: No tenderness, lesions or rash.    No vaginal discharge, erythema or tenderness.      Right Adnexa: not tender and no mass present.    Left Adnexa: not tender and no mass present.    No cervical friability or polyp.     Uterus is not enlarged or tender.  Breasts:     Right: No mass, nipple discharge, skin change or tenderness.     Left: No mass, nipple discharge, skin change or tenderness.    Neck:     Thyroid: No thyromegaly.  Cardiovascular:     Rate and Rhythm: Normal rate and regular rhythm.     Heart sounds: Normal heart sounds. No murmur heard.   Pulmonary:     Effort: Pulmonary effort is normal.     Breath sounds: Normal breath sounds.  Abdominal:     Palpations: Abdomen is soft.     Tenderness: There is no abdominal tenderness. There is no guarding or rebound.  Musculoskeletal:        General: Normal range of motion.     Cervical back: Normal range of motion.  Lymphadenopathy:     Cervical: No cervical adenopathy.  Neurological:     General: No focal deficit present.     Mental Status: She is alert and oriented to person, place, and time.     Cranial Nerves: No cranial nerve deficit.  Skin:    General: Skin is warm and dry.  Psychiatric:        Mood and Affect: Mood normal.        Behavior: Behavior normal.        Thought Content: Thought content normal.        Judgment: Judgment normal.  Vitals reviewed.     Assessment/Plan: Encounter for annual routine gynecological examination  Encounter for surveillance of contraceptive pills - Plan: desogestrel-ethinyl estradiol (KARIVA) 0.15-0.02/0.01 MG (21/5) tablet; OCP RF  Meds  ordered this encounter  Medications  . desogestrel-ethinyl estradiol (KARIVA) 0.15-0.02/0.01 MG (21/5) tablet    Sig: Take 1 tablet by mouth daily.    Dispense:  84 tablet    Refill:  3    Order Specific Question:   Supervising Provider    Answer:   10-18-1975 Nadara Mustard             GYN counsel adequate intake of calcium and vitamin D, diet and exercise     F/U  Return in about 1 year (around 09/16/2021).  Violet Cart B. Rambo Sarafian, PA-C 09/16/2020 9:01 AM

## 2020-09-16 ENCOUNTER — Ambulatory Visit (INDEPENDENT_AMBULATORY_CARE_PROVIDER_SITE_OTHER): Payer: 59 | Admitting: Obstetrics and Gynecology

## 2020-09-16 ENCOUNTER — Other Ambulatory Visit: Payer: Self-pay

## 2020-09-16 ENCOUNTER — Encounter: Payer: Self-pay | Admitting: Obstetrics and Gynecology

## 2020-09-16 VITALS — BP 110/80 | Ht 66.0 in | Wt 174.0 lb

## 2020-09-16 DIAGNOSIS — Z01419 Encounter for gynecological examination (general) (routine) without abnormal findings: Secondary | ICD-10-CM

## 2020-09-16 DIAGNOSIS — Z3041 Encounter for surveillance of contraceptive pills: Secondary | ICD-10-CM | POA: Diagnosis not present

## 2020-09-16 MED ORDER — DESOGESTREL-ETHINYL ESTRADIOL 0.15-0.02/0.01 MG (21/5) PO TABS: 1.0000 | ORAL_TABLET | Freq: Every day | ORAL | 3 refills | Status: DC

## 2020-09-16 NOTE — Patient Instructions (Signed)
I value your feedback and you entrusting us with your care. If you get a [facility] patient survey, I would appreciate you taking the time to let us know about your experience today. Thank you! ? ? ?

## 2021-08-21 ENCOUNTER — Other Ambulatory Visit: Payer: Self-pay

## 2021-08-21 DIAGNOSIS — Z3041 Encounter for surveillance of contraceptive pills: Secondary | ICD-10-CM

## 2021-08-21 MED ORDER — DESOGESTREL-ETHINYL ESTRADIOL 0.15-0.02/0.01 MG (21/5) PO TABS: 1.0000 | ORAL_TABLET | Freq: Every day | ORAL | 3 refills | Status: DC

## 2021-10-13 ENCOUNTER — Ambulatory Visit: Payer: Self-pay | Admitting: Obstetrics and Gynecology

## 2022-02-11 ENCOUNTER — Ambulatory Visit: Payer: Self-pay | Admitting: Obstetrics and Gynecology

## 2022-10-11 ENCOUNTER — Other Ambulatory Visit: Payer: Self-pay | Admitting: Obstetrics and Gynecology

## 2022-10-11 DIAGNOSIS — Z3041 Encounter for surveillance of contraceptive pills: Secondary | ICD-10-CM

## 2022-10-16 ENCOUNTER — Other Ambulatory Visit: Payer: Self-pay | Admitting: Obstetrics and Gynecology

## 2022-10-16 DIAGNOSIS — Z3041 Encounter for surveillance of contraceptive pills: Secondary | ICD-10-CM

## 2022-10-18 ENCOUNTER — Other Ambulatory Visit: Payer: Self-pay

## 2022-10-18 DIAGNOSIS — Z3041 Encounter for surveillance of contraceptive pills: Secondary | ICD-10-CM

## 2022-10-18 MED ORDER — DESOGESTREL-ETHINYL ESTRADIOL 0.15-0.02/0.01 MG (21/5) PO TABS: 1.0000 | ORAL_TABLET | Freq: Every day | ORAL | 1 refills | Status: DC

## 2022-10-18 NOTE — Telephone Encounter (Signed)
Patient contacted office requesting refill on birth control, patient states that she was told the earliest appointment for physical with Helmut Muster was 8/20, patient is requesting refill to last till appointment. KW

## 2022-12-13 NOTE — Progress Notes (Unsigned)
PCP:  Mick Sell, MD   No chief complaint on file.    HPI:      [name] is a 42 y.o. No obstetric history on file. who LMP was No LMP recorded., presents today for her annual examination.  Her menses are regular every 28-30 days, lasting 2-3 days.  Dysmenorrhea mild. She does not have intermenstrual bleeding.  Sex activity: single partner, contraception - OCP (estrogen/progesterone).  Last Pap: 06/27/18 Results: no abnormalities/neg HPV NDA; no hx of abn Hx of STDs: none  There is no FH of breast cancer. There is no FH of ovarian cancer. The patient does do self-breast exams.  Tobacco use: The patient denies current or previous tobacco use. Alcohol use: social drinker No drug use.  Exercise: moderately active  She does get adequate calcium but not Vitamin D in her diet.   Past Medical History:  Diagnosis Date   Acid reflux    omeprazole prescribed by GI, pt produces too much acid   Wears contact lenses     Past Surgical History:  Procedure Laterality Date   CESAREAN SECTION     ESOPHAGOGASTRODUODENOSCOPY (EGD) WITH PROPOFOL N/A 02/21/2019   Procedure: ESOPHAGOGASTRODUODENOSCOPY (EGD) WITH PROPOFOL;  Surgeon: Toney Reil, MD;  Location: [facility];  Service: Endoscopy;  Laterality: N/A;    Family History  Problem Relation Age of Onset   Breast cancer Neg Hx    Ovarian cancer Neg Hx     Social History   Socioeconomic History   Marital status: Married    Spouse name: Not on file   Number of children: Not on file   Years of education: Not on file   Highest education level: Not on file  Occupational History   Not on file  Tobacco Use   Smoking status: Never   Smokeless tobacco: Never  Vaping Use   Vaping status: Never Used  Substance and Sexual Activity   Alcohol use: Not Currently   Drug use: Never   Sexual activity: Yes    Birth control/protection: Pill  Other Topics Concern   Not on file  Social History  Narrative   Not on file   Social Determinants of Health   Financial Resource Strain: Not on file  Food Insecurity: Not on file  Transportation Needs: Not on file  Physical Activity: Not on file  Stress: Not on file  Social Connections: Not on file  Intimate Partner Violence: Not on file    Outpatient Medications Prior to Visit  Medication Sig Dispense Refill   cetirizine (ZYRTEC) 10 MG tablet Take by mouth.     desogestrel-ethinyl estradiol (KARIVA) 0.15-0.02/0.01 MG (21/5) tablet Take 1 tablet by mouth daily. 84 tablet 1   Multiple Vitamin (MULTIVITAMIN PO) Take by mouth daily.     omeprazole (PRILOSEC) 40 MG capsule Take 40 mg by mouth 2 (two) times daily.     No facility-administered medications prior to visit.      ROS:  Review of Systems  Constitutional:  Negative for fatigue, fever and unexpected weight change.  Respiratory:  Negative for cough, shortness of breath and wheezing.   Cardiovascular:  Negative for chest pain, palpitations and leg swelling.  Gastrointestinal:  Negative for blood in stool, constipation, diarrhea, nausea and vomiting.  Endocrine: Negative for cold intolerance, heat intolerance and polyuria.  Genitourinary:  Negative for dyspareunia, dysuria, flank pain, frequency, genital sores, hematuria, menstrual problem, pelvic pain, urgency, vaginal bleeding, vaginal discharge and vaginal pain.  Musculoskeletal:  Negative for back pain, joint swelling and myalgias.  Skin:  Negative for rash.  Neurological:  Negative for dizziness, syncope, light-headedness, numbness and headaches.  Hematological:  Negative for adenopathy.  Psychiatric/Behavioral:  Negative for agitation, confusion, sleep disturbance and suicidal ideas. The patient is not nervous/anxious.   BREAST: No symptoms   Objective: There were no vitals taken for this visit.   Physical Exam Constitutional:      Appearance: She is well-developed.  Genitourinary:     Vulva normal.     Right  Labia: No rash, tenderness or lesions.    Left Labia: No tenderness, lesions or rash.    No vaginal discharge, erythema or tenderness.      Right Adnexa: not tender and no mass present.    Left Adnexa: not tender and no mass present.    No cervical friability or polyp.     Uterus is not enlarged or tender.  Breasts:    Right: No mass, nipple discharge, skin change or tenderness.     Left: No mass, nipple discharge, skin change or tenderness.  Neck:     Thyroid: No thyromegaly.  Cardiovascular:     Rate and Rhythm: Normal rate and regular rhythm.     Heart sounds: Normal heart sounds. No murmur heard. Pulmonary:     Effort: Pulmonary effort is normal.     Breath sounds: Normal breath sounds.  Abdominal:     Palpations: Abdomen is soft.     Tenderness: There is no abdominal tenderness. There is no guarding or rebound.  Musculoskeletal:        General: Normal range of motion.     Cervical back: Normal range of motion.  Lymphadenopathy:     Cervical: No cervical adenopathy.  Neurological:     General: No focal deficit present.     Mental Status: She is alert and oriented to person, place, and time.     Cranial Nerves: No cranial nerve deficit.  Skin:    General: Skin is warm and dry.  Psychiatric:        Mood and Affect: Mood normal.        Behavior: Behavior normal.        Thought Content: Thought content normal.        Judgment: Judgment normal.  Vitals reviewed.     Assessment/Plan: Encounter for annual routine gynecological examination  Encounter for surveillance of contraceptive pills - Plan: desogestrel-ethinyl estradiol (KARIVA) 0.15-0.02/0.01 MG (21/5) tablet; OCP RF  No orders of the defined types were placed in this encounter.            GYN counsel adequate intake of calcium and vitamin D, diet and exercise     F/U  No follow-ups on file.  Elnore Cosens B. Donnella Morford, PA-C 12/13/2022 9:24 PM

## 2022-12-14 ENCOUNTER — Encounter: Payer: Self-pay | Admitting: Obstetrics and Gynecology

## 2022-12-14 ENCOUNTER — Other Ambulatory Visit (HOSPITAL_COMMUNITY)
Admission: RE | Admit: 2022-12-14 | Discharge: 2022-12-14 | Disposition: A | Discharge status: Home / Self Care | Payer: 59 | Source: Ambulatory Visit | Attending: Obstetrics and Gynecology | Admitting: Obstetrics and Gynecology

## 2022-12-14 ENCOUNTER — Ambulatory Visit: Payer: 59 | Admitting: Obstetrics and Gynecology

## 2022-12-14 VITALS — BP 120/82 | HR 102 | Ht 66.0 in | Wt 192.6 lb

## 2022-12-14 DIAGNOSIS — Z3041 Encounter for surveillance of contraceptive pills: Secondary | ICD-10-CM

## 2022-12-14 DIAGNOSIS — Z1231 Encounter for screening mammogram for malignant neoplasm of breast: Secondary | ICD-10-CM

## 2022-12-14 DIAGNOSIS — Z1151 Encounter for screening for human papillomavirus (HPV): Secondary | ICD-10-CM | POA: Diagnosis present

## 2022-12-14 DIAGNOSIS — Z01419 Encounter for gynecological examination (general) (routine) without abnormal findings: Secondary | ICD-10-CM

## 2022-12-14 DIAGNOSIS — Z124 Encounter for screening for malignant neoplasm of cervix: Secondary | ICD-10-CM | POA: Diagnosis present

## 2022-12-14 DIAGNOSIS — K5901 Slow transit constipation: Secondary | ICD-10-CM

## 2022-12-14 MED ORDER — DESOGESTREL-ETHINYL ESTRADIOL 0.15-0.02/0.01 MG (21/5) PO TABS: 1.0000 | ORAL_TABLET | Freq: Every day | ORAL | 3 refills | Status: DC

## 2022-12-14 NOTE — Patient Instructions (Signed)
I value your feedback and you entrusting us with your care. If you get a [facility] patient survey, I would appreciate you taking the time to let us know about your experience today. Thank you!  [facility] ([place])--[phone]  

## 2022-12-17 LAB — CYTOLOGY - PAP
Comment: NEGATIVE
Diagnosis: UNDETERMINED — AB
High risk HPV: NEGATIVE

## 2023-12-09 ENCOUNTER — Other Ambulatory Visit: Payer: Self-pay

## 2023-12-09 ENCOUNTER — Other Ambulatory Visit: Payer: Self-pay | Admitting: Obstetrics and Gynecology

## 2023-12-09 DIAGNOSIS — Z3041 Encounter for surveillance of contraceptive pills: Secondary | ICD-10-CM

## 2023-12-09 MED ORDER — DESOGESTREL-ETHINYL ESTRADIOL 0.15-0.02/0.01 MG (21/5) PO TABS: 1.0000 | ORAL_TABLET | Freq: Every day | ORAL | 0 refills | Status: DC

## 2024-01-02 ENCOUNTER — Other Ambulatory Visit: Payer: Self-pay | Admitting: Obstetrics and Gynecology

## 2024-01-02 DIAGNOSIS — Z3041 Encounter for surveillance of contraceptive pills: Secondary | ICD-10-CM

## 2024-01-03 NOTE — Progress Notes (Unsigned)
 PCP:  Epifanio Alm SQUIBB, MD   No chief complaint on file.    HPI:      [name] is a 43 y.o. No obstetric history on file. who LMP was No LMP recorded., presents today for her annual examination.  Her menses are regular every 28-30 days, lasting 3-5 days. Light flow.  Dysmenorrhea mild. She does not have intermenstrual bleeding.  Sex activity: single partner, contraception - OCP (estrogen/progesterone).  No pain/bleeding/dryness.  Last Pap: 12/14/22 Rseults wre ASCUS/neg HPV DNA; repeat pap due.  06/27/18 Results: no abnormalities/neg HPV NDA; no hx of abn Hx of STDs: none  Last mammo: never There is no FH of breast cancer. There is no FH of ovarian cancer. The patient does do self-breast exams.  Tobacco use: The patient denies current or previous tobacco use. Alcohol use: none No drug use.  Exercise: moderately active  She does get adequate calcium and some Vitamin D in her diet.  Issues with constipation/bloating, with gas that is hard to pass. Does laxative 4-5 times weekly for BM. Did metamucil in past which worsened bloating; did probiotics in past with a little improvement. Drinks lots of water . Wonders if doing too many kegels strengthens pelvic muscles too much, thereby affecting gas/intestines.    Past Medical History:  Diagnosis Date   Acid reflux    omeprazole  prescribed by GI, pt produces too much acid   Wears contact lenses     Past Surgical History:  Procedure Laterality Date   CESAREAN SECTION     ESOPHAGOGASTRODUODENOSCOPY (EGD) WITH PROPOFOL  N/A 02/21/2019   Procedure: ESOPHAGOGASTRODUODENOSCOPY (EGD) WITH PROPOFOL ;  Surgeon: Unk Corinn Skiff, MD;  Location: [facility];  Service: Endoscopy;  Laterality: N/A;    Family History  Problem Relation Age of Onset   Breast cancer Neg Hx    Ovarian cancer Neg Hx     Social History   Socioeconomic History   Marital status: Married    Spouse name: Not on file   Number of  children: Not on file   Years of education: Not on file   Highest education level: Not on file  Occupational History   Not on file  Tobacco Use   Smoking status: Never   Smokeless tobacco: Never  Vaping Use   Vaping status: Never Used  Substance and Sexual Activity   Alcohol use: Not Currently   Drug use: Never   Sexual activity: Yes    Birth control/protection: Pill  Other Topics Concern   Not on file  Social History Narrative   Not on file   Social Drivers of Health   Financial Resource Strain: Not on file  Food Insecurity: Not on file  Transportation Needs: Not on file  Physical Activity: Not on file  Stress: Not on file  Social Connections: Not on file  Intimate Partner Violence: Not on file    Outpatient Medications Prior to Visit  Medication Sig Dispense Refill   cetirizine (ZYRTEC) 10 MG tablet Take by mouth. (Patient not taking: Reported on 12/14/2022)     desogestrel -ethinyl estradiol  (KARIVA ) 0.15-0.02/0.01 MG (21/5) tablet Take 1 tablet by mouth daily. 28 tablet 0   Multiple Vitamin (MULTIVITAMIN PO) Take by mouth daily. (Patient not taking: Reported on 12/14/2022)     omeprazole  (PRILOSEC) 40 MG capsule Take 40 mg by mouth 2 (two) times daily. (Patient not taking: Reported on 12/14/2022)     No facility-administered medications prior to visit.      ROS:  Review  of Systems  Constitutional:  Negative for fatigue, fever and unexpected weight change.  Respiratory:  Negative for cough, shortness of breath and wheezing.   Cardiovascular:  Negative for chest pain, palpitations and leg swelling.  Gastrointestinal:  Positive for constipation. Negative for blood in stool, diarrhea, nausea and vomiting.  Endocrine: Negative for cold intolerance, heat intolerance and polyuria.  Genitourinary:  Negative for dyspareunia, dysuria, flank pain, frequency, genital sores, hematuria, menstrual problem, pelvic pain, urgency, vaginal bleeding, vaginal discharge and vaginal pain.   Musculoskeletal:  Negative for back pain, joint swelling and myalgias.  Skin:  Negative for rash.  Neurological:  Negative for dizziness, syncope, light-headedness, numbness and headaches.  Hematological:  Negative for adenopathy.  Psychiatric/Behavioral:  Negative for agitation, confusion, sleep disturbance and suicidal ideas. The patient is not nervous/anxious.   BREAST: No symptoms   Objective: There were no vitals taken for this visit.   Physical Exam Constitutional:      Appearance: She is well-developed.  Genitourinary:     Vulva normal.     Right Labia: No rash, tenderness or lesions.    Left Labia: No tenderness, lesions or rash.    No vaginal discharge, erythema or tenderness.      Right Adnexa: not tender and no mass present.    Left Adnexa: not tender and no mass present.    No cervical friability or polyp.     Uterus is not enlarged or tender.  Breasts:    Right: No mass, nipple discharge, skin change or tenderness.     Left: No mass, nipple discharge, skin change or tenderness.  Neck:     Thyroid: No thyromegaly.  Cardiovascular:     Rate and Rhythm: Normal rate and regular rhythm.     Heart sounds: Normal heart sounds. No murmur heard. Pulmonary:     Effort: Pulmonary effort is normal.     Breath sounds: Normal breath sounds.  Abdominal:     Palpations: Abdomen is soft.     Tenderness: There is no abdominal tenderness. There is no guarding or rebound.  Musculoskeletal:        General: Normal range of motion.     Cervical back: Normal range of motion.  Lymphadenopathy:     Cervical: No cervical adenopathy.  Neurological:     General: No focal deficit present.     Mental Status: She is alert and oriented to person, place, and time.     Cranial Nerves: No cranial nerve deficit.  Skin:    General: Skin is warm and dry.  Psychiatric:        Mood and Affect: Mood normal.        Behavior: Behavior normal.        Thought Content: Thought content normal.         Judgment: Judgment normal.  Vitals reviewed.     Assessment/Plan: Encounter for annual routine gynecological examination  Cervical cancer screening - Plan: Cytology - PAP  Screening for HPV (human papillomavirus) - Plan: Cytology - PAP  Encounter for surveillance of contraceptive pills - Plan: desogestrel -ethinyl estradiol  (KARIVA ) 0.15-0.02/0.01 MG (21/5) tablet; OCP RF  Encounter for screening mammogram for malignant neoplasm of breast - Plan: [facility] 3D SCREENING MAMMOGRAM BILATERAL BREAST; pt to schedule mammo  Slow transit constipation--add probiotics/benefiber, cont water . F/u prn sx. Doubt pelvic muscles affecting bloating/gas.    No orders of the defined types were placed in this encounter.            GYN counsel  adequate intake of calcium and vitamin D, diet and exercise     F/U  No follow-ups on file.  Nico Rogness B. Alfredo Spong, PA-C 01/03/2024 5:10 PM

## 2024-01-05 ENCOUNTER — Ambulatory Visit: Admitting: Obstetrics and Gynecology

## 2024-01-05 ENCOUNTER — Other Ambulatory Visit (HOSPITAL_COMMUNITY)
Admission: RE | Admit: 2024-01-05 | Discharge: 2024-01-05 | Disposition: A | Discharge status: Home / Self Care | Source: Ambulatory Visit | Attending: Obstetrics and Gynecology | Admitting: Obstetrics and Gynecology

## 2024-01-05 ENCOUNTER — Encounter: Payer: Self-pay | Admitting: Obstetrics and Gynecology

## 2024-01-05 VITALS — BP 114/75 | HR 106 | Ht 66.0 in | Wt 188.0 lb

## 2024-01-05 DIAGNOSIS — Z124 Encounter for screening for malignant neoplasm of cervix: Secondary | ICD-10-CM

## 2024-01-05 DIAGNOSIS — R87610 Atypical squamous cells of undetermined significance on cytologic smear of cervix (ASC-US): Secondary | ICD-10-CM | POA: Diagnosis present

## 2024-01-05 DIAGNOSIS — Z01419 Encounter for gynecological examination (general) (routine) without abnormal findings: Secondary | ICD-10-CM

## 2024-01-05 DIAGNOSIS — Z1151 Encounter for screening for human papillomavirus (HPV): Secondary | ICD-10-CM

## 2024-01-05 DIAGNOSIS — Z3041 Encounter for surveillance of contraceptive pills: Secondary | ICD-10-CM

## 2024-01-05 DIAGNOSIS — Z1231 Encounter for screening mammogram for malignant neoplasm of breast: Secondary | ICD-10-CM

## 2024-01-05 MED ORDER — DESOGESTREL-ETHINYL ESTRADIOL 0.15-0.02/0.01 MG (21/5) PO TABS: 1.0000 | ORAL_TABLET | Freq: Every day | ORAL | 3 refills | Status: AC

## 2024-01-05 NOTE — Patient Instructions (Signed)
 I value your feedback and you entrusting [place] with your care. If you get a [facility] patient survey, I would appreciate you taking the time to let us know about your experience today. Thank you!  [facility] ([place])--[phone]

## 2024-01-10 ENCOUNTER — Ambulatory Visit: Payer: Self-pay | Admitting: Obstetrics and Gynecology

## 2024-01-10 LAB — CYTOLOGY - PAP
Comment: NEGATIVE
High risk HPV: POSITIVE — AB
